# Patient Record
Sex: Female | Born: 1990 | Race: Black or African American | Hispanic: No | Marital: Married | Smoking: Current some day smoker
Health system: Southern US, Community
[De-identification: ages and names within clinical notes are randomized; demographics above are authoritative.]

## PROBLEM LIST (undated history)

## (undated) DIAGNOSIS — D509 Iron deficiency anemia, unspecified: Secondary | ICD-10-CM

## (undated) DIAGNOSIS — Z113 Encounter for screening for infections with a predominantly sexual mode of transmission: Principal | ICD-10-CM

## (undated) DIAGNOSIS — B9689 Other specified bacterial agents as the cause of diseases classified elsewhere: Secondary | ICD-10-CM

## (undated) DIAGNOSIS — N760 Acute vaginitis: Principal | ICD-10-CM

## (undated) HISTORY — PX: BREAST SURGERY: SHX581

---

## 2015-09-06 NOTE — Discharge Summary (Signed)
Inpatient Patient Summary       ;        [facility]  [address]  [place], [place] [address]  [phone]  Patient Discharge Instructions     Name: Annette Hale, Annette Hale  Current Date: 09/06/2015 10:47:04  DOB: [birth year] MRN: [id number] FIN: [id number]%[phone]  Patient Address: [address] [place] [address]  Patient Phone: [phone]  Primary Care Provider:  Name: Ledell Noss  Phone: [phone]   Immunizations Provided:       Discharge Diagnosis: Anterior dislocation of right shoulder  Discharged To: TO, ANTICIPATED%>  Home Treatments: TREATMENTS, ANTICIPATED%>  Devices/Equipment: EQUIPMENT REHAB%>  Post Hospital Services: HOSPITAL SERVICES%>  Professional Skilled Services: SKILLED SERVICES%>  Special Services and Community Resources:                SERV AND COMM RES, ANTICIPATED%>  Mode of Discharge Transportation: TRANSPORTATION%>  Discharge Orders         Discharge Patient 09/06/15 10:39:00 EDT         Comment:      Medications   During the course of your visit, your medication list was updated with the most current information. The details of those changes are reflected below:         New Medications  Printed Prescriptions  acetaminophen-oxyCODONE (Percocet 5/325 oral tablet) 2 Tabs Oral (given by mouth) every 6 hours as needed as needed for pain. Refills: 0., MAX DAILY DOSE OF ACETAMINOPHEN = 3000 MG  Last Dose:____________________  These Medications Were Removed and Should No Longer Be Taken  acetaminophen (Tylenol) 500 Milligram Oral (given by mouth) every 6 hours as needed mild pain (1-3)., MAX DAILY DOSE OF ACETAMINOPHEN = 3000 MG  Stop Taking Reason: Physician Request         [facility] would like to thank you for allowing [place] to assist you with your healthcare needs. The following includes patient education materials and information regarding your injury/illness.     Annette Hale, [place] Annette Hale has been given the following list of follow-up instructions, prescriptions, and patient  education materials:  Follow-up Instructions             With: Address: When:   BRETT YOUNG-MD [address], [address]  [place], [place]  [address]  [phone] Business (1) In 6 days 09/12/2015                       It is important to always keep an active list of medications available so that you can share with other providers and manage your medications appropriately. As an additional courtesy, we are also providing you with your final active medications list that you can keep with you.           acetaminophen-oxyCODONE (Percocet 5/325 oral tablet) 2 Tabs Oral (given by mouth) every 6 hours as needed as needed for pain. Refills: 0., MAX DAILY DOSE OF ACETAMINOPHEN = 3000 MG      Take only the medications listed above. Contact your doctor prior to taking any medications not on this list.        Discharge instructions, if any, will display below     Instructions for Diet: INSTRUCTIONS FOR DIET%>A Healthy Diet   Instructions for Supplements: SUPPLEMENT INSTRUCTIONS%>   Instructions for Activity: INSTRUCTIONS FOR ACTIVITY%>Other: Keep right arm in a sling.   Instructions for Wound Care: INSTRUCTIONS FOR WOUND CARE%>Other: Aqua cell dressing, leave dressing on for one week. May  shower with dressing on.     Medication leaflets, if any, will display below         Patient education materials, if any, will display below           UPPER EXTREMITY (SHOULDER) - Out Patient Post Operative Instructions  General Information:  - You may experience lightheadedness, forgetfulness, dizziness, sleepiness, headache, nausea, sore throat, or muscular pains following surgery  - For any emergencies call 911  -Your reflexes will be dimished after receiving anesthetic drugs         Do not operate a vechicle or heavy machinery for 24 hours         Do not drink any alcoholic beverages or smoke for 24 hours         Avoid making any important decisions for 24 hours         Do not stay alone for the next 24 hours  Diet/Fluids  -Begin  with clear liquids, then progress to your regular diet if no nausea   -Greasy and spicy foods are not advised  Activity  -You are advised to go directly home from the hospital and restrict your activites for the rest of the day  Medications:  -Follow your Discharge Medication sheet, as instructed  -If ordered, begin any newly prescribed medications. Discontinue use if: nausea, vomiting, itching or rash develops and call your doctor   -If you develop a fever (over 101*), chills, active bleeding, excessive swelling, or nausea or vomiting past the 24hr period call your doctor.   Specific Instructions:  -Keep dressing clean, dry and intact for:   -Ice shoulder 48-72 hours, 30 min on, and 30 min off while awake  -You may be more comfortable sleeping propped up or in a recliner  -Check for circulation changes to operative area (pale/blue color)  -Open and close hand to exercise, other exercises as instructed  -Wear sling at all times, even during sleep except for shower and exercises  Follow up Care:  Call the office if you don't already have an appt scheduled.           Discharge Instructions: After Your Surgery   Youve just had surgery. During surgery you were given medicine called anesthesia to keep you relaxed and free of pain. After surgery you may have some pain or nausea. This is common. Here are some tips for feeling better and getting well after surgery.       Stay on schedule with your medication.    Going home   Your doctor or nurse will show you how to take care of yourself when you go home. He or she will also answer your questions. Have an adult family member or friend drive you home. For the first 24 hours after your surgery:    Do not drive or use heavy equipment.    Do not make important decisions or sign legal papers.    Do not drink alcohol.    Have someone stay with you, if needed. He or she can watch for problems and help keep you safe.   Be sure to go to all follow-up visits with your doctor. And  rest after your surgery for as long as your doctor tells you to.   Coping with pain   If you have pain after surgery, pain medicine will help you feel better. Take it as told, before pain becomes severe. Also, ask your doctor or pharmacist about other ways to control pain. This might be  with heat, ice, or relaxation. And follow any other instructions your surgeon or nurse gives you.   Tips for taking pain medicine   To get the best relief possible, remember these points:    Pain medicines can upset your stomach. Taking them with a little food may help.    Most pain relievers taken by mouth need at least 20 to 30 minutes to start to work.    Taking medicine on a schedule can help you remember to take it. Try to time your medicine so that you can take it before starting an activity. This might be before you get dressed, go for a walk, or sit down for dinner.    Constipation is a common side effect of pain medicines. Call your doctor before taking any medicines such as laxatives or stool softeners to help ease constipation. Also ask if you should skip any foods. Drinking lots of fluids and eating foods such as fruits and vegetables that are high in fiber can also help. Remember, do not take laxatives unless your surgeon has prescribed them.    Drinking alcohol and taking pain medicine can cause dizziness and slow your breathing. It can even be deadly. Do not drink alcohol while taking pain medicine.    Pain medicine can make you react more slowly to things. Do not drive or run machinery while taking pain medicine.   Your health care provider may tell you to take acetaminophen to help ease your pain. Ask him or her how much you are supposed to take each day. Acetaminophen or other pain relievers may interact with your prescription medicines or other over-the-counter (OTC) drugs. Some prescription medicines have acetaminophen and other ingredients. Using both prescription and OTC acetaminophen for pain can cause  you to overdose. Read the labels on your OTC medicines with care. This will help you to clearly know the list of ingredients, how much to take, and any warnings. It may also help you not take too much acetaminophen. If you have questions or do not understand the information, ask your pharmacist or health care provider to explain it to you before you take the OTC medicine.   Managing nausea   Some people have an upset stomach after surgery. This is often because of anesthesia, pain, or pain medicine, or the stress of surgery. These tips will help you handle nausea and eat healthy foods as you get better. If you were on a special food plan before surgery, ask your doctor if you should follow it while you get better. These tips may help:    Do not push yourself to eat. Your body will tell you when to eat and how much.    Start off with clear liquids and soup. They are easier to digest.    Next try semi-solid foods, such as mashed potatoes, applesauce, and gelatin, as you feel ready.    Slowly move to solid foods. Dont eat fatty, rich, or spicy foods at first.    Do not force yourself to have 3 large meals a day. Instead eat smaller amounts more often.    Take pain medicines with a small amount of solid food, such as crackers or toast, to avoid nausea.       Call your surgeon if.    You still have pain an hour after taking medicine. The medicine may not be strong enough.    You feel too sleepy, dizzy, or groggy. The medicine may be too strong.    You  have side effects like nausea, vomiting, or skin changes, such as rash, itching, or hives.        If you have obstructive sleep apnea   You were given anesthesia medicine during surgery to keep you comfortable and free of pain. After surgery, you may have more apnea spells because of this medicine and other medicines you were given. The spells may last longer than usual.    At home:    Keep using the continuous positive airway pressure (CPAP) device when you  sleep. Unless your health care provider tells you not to, use it when you sleep, day or night. CPAP is a common device used to treat obstructive sleep apnea.    Talk with your provider before taking any pain medicine, muscle relaxants, or sedatives. Your provider will tell you about the possible dangers of taking these medicines.      [address] [employer] [address] [place], [place] [address]. All rights reserved. This information is not intended as a substitute for professional medical care. Always follow your healthcare professional's instructions.               IS IT A STROKE?  Act FAST and Check for these signs:     FACE                  Does the face look uneven?     ARM                    Does one arm drift down?     SPEECH             Does their speech sound strange?     TIME                   Call 9-1-1 at any sign of stroke  Heart Attack Signs  Chest discomfort: Most heart attacks involve discomfort in the center of the chest and lasts more than a few minutes, or goes away and comes back. It can feel like uncomfortable pressure, squeezing, fullness or pain.  Discomfort in upper body: Symptoms can include pain or discomfort in one or both arms, back, neck, jaw or stomach.  Shortness of breath: With or without discomfort.  Other signs: Breaking out in a cold sweat, nausea, or lightheaded.  Remember, MINUTES DO MATTER. If you experience any of these heart attack warning signs, call 9-1-1 to get immediate medical attention!             Yes - Patient/Family/Caregiver demonstrates understanding of instructions given  ______________________________ ___________ ___________________ ___________  Patient/Family/ Caregiver Signature Date/Time          Provider Signature Date/Time

## 2015-09-06 NOTE — Nursing Note (Signed)
Nursing Discharge Summary - Text       Physician Discharge Summary Entered On:  09/06/2015 10:45 EDT    Performed On:  09/06/2015 10:44 EDT by Alexandria LodgeMERRILL-MD,  Tre Sanker               DC Information   Provider Instructions for Diet :   A Healthy Diet   Provider Instructions for Activity :   Other: Keep right arm in a sling.   Provider Instructions for Wound Care :   Other: Aqua cell dressing, leave dressing on for one week. May shower with dressing on.   Autymn Omlor-MD,  Shakena Callari - 09/06/2015 10:44 EDT

## 2015-09-06 NOTE — Procedures (Signed)
 IntraOp Record - [facility]             IntraOp Record - [facility] Summary                                                                   Primary Physician:        ANNIE ECK    Case Number:              [phone]    Finalized Date/Time:      09/06/15 19:48:27    Pt. Name:                 Annette Hale, Annette Hale    D.O.B./Sex:               [birth year]    Female    Med Rec #:                [id number]    Physician:                ANNIE ECK    Financial #:              [id number]    Pt. Type:                 S    Room/Bed:                 /    Admit/Disch:              09/06/15 06:30:00 -                              09/06/15 13:54:00    Institution:       [facility] - Case Times                                                                                                         Entry 1                                                                                                          Patient      In Room Time             09/06/15 07:48:00               Out Room Time  09/06/15 10:31:00    Anesthesia     Procedure      Start Time               09/06/15 08:18:00               Stop Time                       09/06/15 10:22:00    Last Modified By:         JANE RN, BONNIE                              09/06/15 10:36:16      [facility] - Case Times Audit                                                                          09/06/15 10:36:16         Owner: KENNIE                               Modifier: DUNNBO                                                        <+> 1         Out Room Time     09/06/15 10:29:55         Owner: KENNIE                               Modifier: DUNNBO                                                        <+> 1         Stop Time        [facility] - Safety Checklist - Sign In                                                                                         Entry 1  History/Physical on       Yes                             Procedure Consent               Yes    Chart                                                     on Chart     Site Marked (if           Yes    applicable)     Last Modified By:         JANE, RN, BONNIE                              09/06/15 08:34:59      [facility] - Case Attendance                                                                                                    Entry 1                         Entry 2                         Entry 3                                          Case Attendee             ATKINSON-MD,  BENEDETTA PEPER,  FRANCIS JANE, RN, BONNIE    Role Performed            Anesthesiologist                Surgeon Primary                 Circulator    Time In                   09/06/15 07:48:00               09/06/15 07:48:00               09/06/15 07:48:00    Time Out                  09/06/15 10:31:00               09/06/15 10:31:00               09/06/15 10:31:00  Procedure                 Shoulder Arthroscopy            Shoulder Arthroscopy            Shoulder Arthroscopy                              with Open Repair(Right)         with Open Repair(Right)         with Open Repair(Right)    Last Modified By:         JANE RN, CONSUELO JANE, RN, CONSUELO JANE, RN, BONNIE                              09/06/15 10:38:03               09/06/15 10:38:03               09/06/15 10:38:03                                Entry 4                         Entry 5                                                                          Case Attendee             Suzy Francis VEAR JOELYN,  BRETT H    Role Performed            Surgical Scrub                  Surgeon Secondary    Time In                   09/06/15 07:48:00    Time Out                  09/06/15 10:31:00    Procedure                 Shoulder Arthroscopy            Shoulder Arthroscopy                               with Open Repair(Right)         with Open Repair(Right)    Last Modified By:         JANE RN, CONSUELO JANE, RN, BONNIE                              09/06/15 10:38:03  09/06/15 10:38:03    General Comments:            Quintin Kuba, [employer] rep Norleen Furrow, resident      [facility] - Case Attendance Audit                                                                     09/06/15 10:38:03         Owner: KENNIE                               Modifier: DUNNBO                                                            1     <*> Time Out            1     <*> Procedure            1     <*> Procedure                              Shoulder Arthroscopy with Open Repair(Right)            1     <*> Procedure                              Shoulder Arthroscopy with Open Repair(Right)            2     <*> Time Out            2     <*> Procedure            2     <*> Procedure                              Shoulder Arthroscopy with Open Repair(Right)            2     <*> Procedure                              Shoulder Arthroscopy with Open Repair(Right)            3     <*> Time Out            3     <*> Procedure            3     <*> Procedure                              Shoulder Arthroscopy with Open Repair(Right)            3     <*> Procedure  Shoulder Arthroscopy with Open Repair(Right)            4     <*> Time Out            4     <*> Procedure            4     <*> Procedure                              Shoulder Arthroscopy with Open Repair(Right)            4     <*> Procedure                              Shoulder Arthroscopy with Open Repair(Right)            5     <*> Case Attendee                          YOUNG-MD,  BRETT H            5     <*> Role Performed            5     <*> Procedure     09/06/15 09:08:57         Owner: DUNNBO                               Modifier: DUNNBO                                                            2     <*> Procedure            2      <*> Procedure            2     <*> Procedure                              Shoulder Arthroscopy with Open Repair(Right)            2     <*> Procedure                              Shoulder Arthroscopy with Open Repair(Right)            2     <*> Procedure                              Shoulder Arthroscopy with Open Repair(Right)            2     <*> Procedure                              Shoulder Arthroscopy with Open Repair(Right)            3     <*> Procedure  3     <*> Procedure            3     <*> Procedure                              Shoulder Arthroscopy with Open Repair(Right)            3     <*> Procedure                              Shoulder Arthroscopy with Open Repair(Right)            3     <*> Procedure                              Shoulder Arthroscopy with Open Repair(Right)            3     <*> Procedure                              Shoulder Arthroscopy with Open Repair(Right)            4     <*> Time In            4     <*> Time In            4     <*> Time In            4     <*> Procedure            4     <*> Procedure            4     <*> Procedure                              Shoulder Arthroscopy with Open Repair(Right)            4     <*> Procedure                              Shoulder Arthroscopy with Open Repair(Right)            4     <*> Procedure                              Shoulder Arthroscopy with Open Repair(Right)            4     <*> Procedure                              Shoulder Arthroscopy with Open Repair(Right)     09/06/15 09:08:56         Owner: ILWWAN                               Modifier: DUNNBO  1     <*> Procedure            1     <*> Procedure            1     <*> Procedure                              Shoulder Arthroscopy with Open Repair(Right)            1     <*> Procedure                              Shoulder Arthroscopy with Open Repair(Right)            1     <*> Procedure                               Shoulder Arthroscopy with Open Repair(Right)            1     <*> Procedure                              Shoulder Arthroscopy with Open Repair(Right)     09/06/15 08:36:38         Owner: ILWWAN                               Modifier: DUNNBO                                                            1     <*> Procedure                              Shoulder Arthroscopy with Open Repair(Right)            1     <*> Procedure                              Shoulder Arthroscopy with Open Repair(Right)            1     <*> Procedure                              Shoulder Arthroscopy with Open Repair(Right)            1     <*> Procedure                              Shoulder Arthroscopy with Open Repair(Right)            2     <*> Procedure                              Shoulder Arthroscopy with Open Repair(Right)  2     <*> Procedure                              Shoulder Arthroscopy with Open Repair(Right)            2     <*> Procedure                              Shoulder Arthroscopy with Open Repair(Right)            2     <*> Procedure                              Shoulder Arthroscopy with Open Repair(Right)            3     <*> Time In            3     <*> Time In            3     <*> Time In            3     <*> Procedure                              Shoulder Arthroscopy with Open Repair(Right)            3     <*> Procedure                              Shoulder Arthroscopy with Open Repair(Right)            3     <*> Procedure                              Shoulder Arthroscopy with Open Repair(Right)            3     <*> Procedure                              Shoulder Arthroscopy with Open Repair(Right)            4     <*> Case Attendee                          Barnhill,  Tabatha H            4     <*> Case Attendee                          Barnhill,  Tabatha H            4     <*> Case Attendee                          Suzy Rhyme H            4     <*> Role Performed            4     <*>  Role Performed  4     <*> Role Performed            4     <+> Procedure     09/06/15 08:34:53         Owner: DUNNBO                               Modifier: DUNNBO                                                            1     <*> Time In            1     <*> Time In            1     <*> Time In            1     <*> Procedure                              Shoulder Arthroscopy with Open Repair(Right)            1     <*> Procedure                              Shoulder Arthroscopy with Open Repair(Right)            1     <*> Procedure                              Shoulder Arthroscopy with Open Repair(Right)            1     <*> Procedure                              Shoulder Arthroscopy with Open Repair(Right)            2     <*> Time In            2     <*> Time In            2     <*> Time In            2     <*> Procedure                              Shoulder Arthroscopy with Open Repair(Right)            2     <*> Procedure                              Shoulder Arthroscopy with Open Repair(Right)            2     <*> Procedure                              Shoulder Arthroscopy with Open Repair(Right)  2     <*> Procedure                              Shoulder Arthroscopy with Open Repair(Right)            3     <*> Case Attendee                          ROSS-DUNN, RN, BONNIE            3     <*> Case Attendee                          ROSS-DUNN, RN, BONNIE            3     <*> Case Attendee                          ROSS-DUNN, RN, BONNIE            3     <*> Role Performed            3     <*> Role Performed            3     <*> Role Performed            3     <+> Procedure        [facility] - Skin Assessment                                                                          Pre-Care Text:            A.240 Assesses baseline skin condition Im.120 Implements protective measures to prevent skin or tissue injury           due to mechanical sources  Im.280.1 Implements progective measures to prevent  skin or tissue injury due to           thermal sources Im.360 Monitors for signs and symptons of infection                              Entry 1                                                                                                          Skin Integrity            Intact    Last Modified By:         JANE, RN, BONNIE  09/06/15 08:41:34    Post-Care Text:            E.10 Evaluates for signs and symptoms of physical injury to skin and tissue E.270 Evaluate tissue perfusion           O.60 Patient is free from signs and symptoms of injury caused by extraneous objects   O.210 Patinet's tissue           perfusion is consistent with or improved from baseline levels      [facility] - Patient Positioning                                                                      Pre-Care Text:            A.240 Assesses baseline skin condition A.280 Identifies baseline musculoskeletal status A.280.1 Identifies           physical alterations that require additional precautions for procedure-specific positioning A.510.8 Maintains           patient's dignity and privacy Im.120 Implements protective measures to prevent skin/tissue injury due to           mechanical sources Im.40 Positions the patient Im.80 Applies safety devices                              Entry 1                                                                                                          Procedure                 Shoulder Arthroscopy            Body Position                   Beach Chair                              with Open Repair(Right)    Left Arm Position         Flexed on Padded Arm            Right Arm Position              Held on [employer] w/Security Strap    Left Leg Position         Pillow Under Knee               Right Leg Position              Pillow Under  Knee    Feet Uncrossed            Yes                             Pressure Points                 Yes                                                               Checked     Additional                Patient in Sitting              Positioning Device              Beach Chair/Shoulder    Information               Position with Wedge                                             Table, Pillow, Safety                              Support under legs                                              Strap, Foam Wedge                              Heels off mattress    Positioned By             ATKINSON-MD,  RANDAL,           Outcome Met (O.80)              Yes                              MERRILL-MD,  KEITH,                              ROSS-DUNN, RN, BONNIE    Last Modified By:         JANE, RN, BONNIE                              09/06/15 08:41:27    Post-Care Text:            A.240 Assesses baseline skin condition A.280 Identifies baseline musculoskeletal status A.280.1 Identifies           physical alterations that require additional precautions for procedure-specific positioning A.510.8 Maintains           patient's dignity and privacy Im.120 Implements protective measures to prevent skin/tissue injury due to  mechanical sources Im.40 Positions the patient Im.80 Applies safety devices      [facility] - Skin Prep                                                                                Pre-Care Text:            A.30 Verifies allergies A.20 Verifies procedure, surgical site, and laterality A.510.8 Maintains paritnet's           dignity and privacy Im.270 Performs Skin Preparation Im.270.1 Implements protective measures to prevent skin           and tissue injury due to chemical sources  A.300.1 Protects from cross-contamination                              Entry 1                                                                                                          Hair Removal     Skin Prep      Prep Agents (Im.270)     Chlorhexidine Gluconate         Prep Area (Im.270)              Shoulder, Arm                              2%  w/Alcohol     Prep Area Details        Right                           Prep By                         ANNIE ECK    Outcome Met (O.100)       Yes    Last Modified ByBETHA MEDAL, RN, BONNIE                              09/06/15 08:41:51    Post-Care Text:            E.10 Evaluates for signs and symptoms of physical injury to skin and tissue O.100 Patient is free from signs           and symptoms of chemical injury  O.740 The patient's right to privacy is maintained      [facility] - Counts Initial and Final  Pre-Care Text:            A.20.2 - Assesses the risk for unintended retained surgical items Im.20 - Performs required counts                              Entry 1                                                                                                          Initial Counts      Initial Counts           ROSS-DUNN, RN, BONNIE,          Items included in               Sponges, Sharps     Performed By             Suzy Francis DEL            the Initial Count     Final Counts      Final Counts             ROSS-DUNN, RN, BONNIE,          Final Count Status              Correct     Performed By             Suzy Francis H     Items Included in        Sponges, Sharps     Final Count     Outcome Met (O.20)        Yes    Last Modified By:         JANE, RN, BONNIE                              09/06/15 10:42:45    Post-Care Text:            E.50 - Evaluates results of the surgical count O.20 - Patient is free from unintended retained surgical items      [facility] - Counts Initial and Final Audit                                                            09/06/15 10:42:45         Owner: DUNNBO                               Modifier: DUNNBO                                                        <+>  1         Final Count Status        <+> 1         Outcome Met (O.20)        [facility] - General Case Data                                                                         Pre-Care Text:            A.350.1 Classifies surgical wound                              Entry 1                                                                                                          Case Information      ASA Class                2                               Case Level                      Level 3     OR                       RH1 04                          Specialty                       Orthopedic (SN)     Wound Class              1-Clean    Preop Diagnosis           RIGHT SHOULDER                              INSTABILITY    Last Modified ByBETHA MEDAL, RN, BONNIE                              09/06/15 09:12:28    Post-Care Text:            O.760 Patient receives consistent and comparable care regardless of the setting      [facility] - Fire Risk Assessment  Entry 1                                                                                                          Fire Risk                 Surgical Site Above             Fire Risk Score                 2    Assessment: If            Xiphoid, Ignition    checked, checkmark        Source In Use    = 1 point     Last Modified By:         JANE, RN, BONNIE                              09/06/15 08:38:12      [facility] - Safety Checklist - Sign Out                                                              Pre-Care Text:            Im.330 Manages specimen handling and disposition                              Entry 1                                                                                                          Patient Safety            Yes    Communication Guide     Used Throughout Case     Last Modified By:         JANE, RN, BONNIE                              09/06/15 08:35:19    Post-Care Text:            E.800 Ensures continuity of care E.50 Evaluates results of the surgical count O.30 Patient's  procedure is           performed on the correct site, side,  and level O.50 patient's current status is communicated throughout the           continuum of care O.40 Patient's specimen(s) is managed in the appropriate manner      [facility] - Cautery                                                                                  Pre-Care Text:            A.240 Assesses baseline skin condition A280.1 Identifies baseline musculoskeletal status Im.50 Implements           protective measures to prevent injury due to electrical sources  Im.60 Uses supplies and equipment within safe           parameters Im.80 Applies safety devices                              Entry 1                                                                                                          ESU Type                  GENERATOR                       Identification                  [id number]                              [employer]              Number     Coag Setting (watts)      4040                            Cut Setting (watts)             40    Grounding Pad             Yes                             Grounding Pad Site              Thigh, left    Needed?     Grounding Pad             ROSS-DUNN, RN, BONNIE           Outcome Met (O.10)  Yes    Applied By     Last Modified By:         JANE, RN, BONNIE                              09/06/15 09:07:30    Post-Care Text:            E.10 Evaluates for signs and symptoms of physical injury to skin and tissue O.10 Patient is free from signs and           symptoms of injury related to thermal sources  O.70 Patient is free from signs and symptoms of electrical injury      [facility] - Patient Care Devices                                                                     Pre-Care Text:            A.200 Assesses risk for normothermia regulation A.40 Verifies presence of prosthetics or corrective devices           Im.280 Implements thermoregulation measures Im.60 Uses supplies and equipment  within safe parameters                              Entry 1                         Entry 2                                                                          Equipment Type            MACHINE SEQUENTIAL              BAIR HUGGER                              COMPRESSION    SCD Sleeve Site           Legs Bilateral    Equipment/Tag Number      [id number]                          [id number]    Initiated Pre             Yes    Induction     Last Modified By:         JANE RN, CONSUELO JANE, RN, BONNIE                              09/06/15 08:40:21               09/06/15 08:40:21    Post-Care Text:  E.10 Evaluates signs and symptoms of physical injury to skin and tissue O.60 Patient is free from signs and           symptoms of injury caused by extraneous objects      [facility] - Medications                                                                              Pre-Care Text:            A.10 Confirms patient identity A.30 Verifies allergies Im.220 Administers prescribed medications Im.220.2           Administers prescribed antibiotic therapy as ordered                              Entry 1                                                                                                          Time Administered         09/06/15 08:12:00               Medication                      EPINEPRINE 1:1000 - 30CC    Route of Admin            Irrigation                      Dose/Volume                     3ml/3000ml lr                                                              (include amount and                                                               unit of measure)     Site                      Shoulder                        Site Detail  Right    Administered By           ANNIE ECK              Outcome Met (O.130)             Yes    Last Modified By:         JANE, RN, BONNIE                              09/06/15 08:39:36    Post-Care Text:            E.20  Evaluates response to medications O.130 Patient receives appropriately administerd medication(s)      [facility] - Medications Audit                                                                         09/06/15 08:39:36         Owner: DUNNBO                               Modifier: DUNNBO                                                            1     <*> Medication                             EPINEPRINE 1:1000 - 30CC            1     <*> Time Administered                      09/06/15 08:38:00        [facility] - Implants/Endoscopy Stents                                                                Pre-Care Text:            A.20 Verifies operative procedure, surgical site, and laterality A.20.1 Verifies consent for planned procedure           Im.350 Records implants inserted during the operative or invasive procedure                              Entry 1  Implant/Explant           Implant                         Catalog #                      [id number]    Implant     Identification      Description              SCREW LATARJET CANN             Manufacturer                    [employer]                              PARTIALLY THREADED                              3.75MM X ARTHREX                              [id number]    Usage Data      Implant Site             right shoulder                  Quantity                        11    Last Modified By:         JANE, RN, BONNIE                              09/06/15 09:54:16    Post-Care Text:            E.30 Evaluates verification process for correct patient, site, side and level surgery O.30 Patient's procedure           is performed on the correct site, side, and level      RHOR - Communication                                                                            Pre-Care Text:            A.520 Identifies barriers to communication (Patient and Family  Communications) A.20 Verifies operative           procedure, surgical site, and laterality Sports coach) Im.500 Provides status reports to family           members Im.150 Develops individualized plan of care                              Entry 1  Communication             Phone Call                      Communication By                JANE OBIE MILLER    Date and Time             09/06/15 09:28:00               Communication To                husband-Ricky    Last Modified By:         JANE RN, BONNIE                              09/06/15 09:53:04    Post-Care Text:            E.520 Evaluates psychosocial response to plan of care O.500 Patient or designated support person demonstrates           knowledge of the expected psychosocial responses to the procedure E.800 Ensures continuity of care O.50           Patient's current status is communicated throughout the continuum of care      RHOR - Dressing/Packing                                                                         Pre-Care Text:            A.350 Assesses susceptibility for infection Im.250 Administers care to invasive devices Im.290 Administer care           to wound sites  Im.300 Implements aseptic technique                              Entry 1                                                                                                          Site                      Shoulder                        Site Details                    Right    Dressing Item     Details      Dressing Item            Silver Impregnated  Miscellaneous                   Shoulder     (Im.290)                 Dressing, Self Adherent         (Im.290)                        Immobilizer/Sling                              Wrap, 4x4's    Last Modified By:         JANE, RN, BONNIE                              09/06/15 10:41:31    Post-Care  Text:            E.320 Evaluate factors associted with increased risk for postoperative infection at the completion of the           procedure O.200 Patient's wound perfusion is consistent with or improved from baseline levels  O.Patient is           free from signs and symptoms of infection      RHOR - Procedures                                                                               Pre-Care Text:            A.20 Verifies operative procedure, surgical site, and laterality Im.150 Develops individualized plan of care                              Entry 1                                                                                                          Procedure     Description      Procedure                Shoulder Arthroscopy            Modifiers                       Right                              with Open Repair     Surgical Procedure       SHOULDER ARTHROSCOPY,     Text  OPEN LATERJET PROCEDURE                              RIGHT    Primary Procedure         Yes                             Primary Surgeon                 ANNIE ECK    Start                     09/06/15 08:18:00               Stop                            09/06/15 10:22:00    Anesthesia Type           General                         Surgical Service                Orthopedic (SN)    Wound Class               1-Clean    Last Modified By:         JANE RN, BONNIE                              09/06/15 10:38:07    Post-Care Text:            O.730 The patinet's care is consistent with the individualized perioperative plan of care      RHOR - Procedures Audit                                                                          09/06/15 10:38:07         Owner: DUNNBO                               Modifier: DUNNBO                                                        <+> 1         Stop        RHOR - Safety Checklist - Time Out                                                              Pre-Care Text:  A.10 Confirms patient identity A.20 Verifies operative procedure, surgical site, and laterality A.20.1 Verifies           consent for planned procedure A.30 Verifies allergies                              Entry 1                                                                                                          Surgical/Procedure        Yes                             Time Out Complete               09/06/15 08:17:00    Team confirms     correct patient,     correct site and     correct procedure     Last Modified By:         JANE RN, BONNIE                              09/06/15 08:35:07    Post-Care Text:            E.30 Evaluates verification process for correct patient, site, side, and level surgery      RHOR - Transfer                                                                                                           Entry 1                                                                                                          Transferred By            JANE, RN, BONNIE,          Via                             [employer]  ATKINSON-MD,  RANDAL    Post-op Destination       PACU    Skin Assessment      Condition                Intact    Last Modified By:         JANE, RN, BONNIE                              09/06/15 08:42:25      Case Comments                                                                                         <None>              Finalized By: EVERARDO NEST      Document Signatures                                                                             Signed By:           JANE RN, BONNIE 09/06/15 13:24          ROSS-DUNN, RN, BONNIE 09/06/15 10:42          TZMUS,  JENNIFER 09/06/15 19:48      Unfinalized History                                                                                     Date/Time            Username    Reason for Unfinalizing         Freetext Reason for Unfinalizing                                           09/06/15 13:19       DUNNBO      Finish Documentation          09/06/15 19:47       [employer]      Charging

## 2015-09-06 NOTE — Discharge Summary (Signed)
 Inpatient Clinical Summary             [facility]  Post-Acute Care Transfer Instructions  PERSON INFORMATION   Name: Annette Hale, Annette Hale   MRN: [id number]    FIN#: [id number]   PHYSICIANS  Admitting Physician: ANNIE ECK  Attending Physician: ANNIE ECK   PCP: PAULENE LAMAR PARAS  Discharge Diagnosis: Anterior dislocation of right shoulder  Comment:       PATIENT EDUCATION INFORMATION  Instructions:             OUT PATIENT POST OP JI SHOULDER (CUSTOM); Anesthesia: After Your Surgery  Medication Leaflets:               Follow-up:                          With: Address: When:   BRETT YOUNG-MD [address], [address]  [place]  [address]  [phone] Business (1) In 6 days 09/12/2015                           MEDICATION LIST  Medication Reconciliation at Discharge:         New Medications  Printed Prescriptions  acetaminophen-oxyCODONE (Percocet 5/325 oral tablet) 2 Tabs Oral (given by mouth) every 6 hours as needed as needed for pain. Refills: 0., MAX DAILY DOSE OF ACETAMINOPHEN = 3000 MG  Last Dose:____________________  These Medications Were Removed and Should No Longer Be Taken  acetaminophen (Tylenol) 500 Milligram Oral (given by mouth) every 6 hours as needed mild pain (1-3)., MAX DAILY DOSE OF ACETAMINOPHEN = 3000 MG  Stop Taking Reason: Physician Request         Patient's Final Home Medication List Upon Discharge:          acetaminophen-oxyCODONE (Percocet 5/325 oral tablet) 2 Tabs Oral (given by mouth) every 6 hours as needed as needed for pain. Refills: 0., MAX DAILY DOSE OF ACETAMINOPHEN = 3000 MG         Comment:       ORDERS         Order Name Order Details   Discharge Patient 09/06/15 10:39:00 EDT

## 2015-09-07 NOTE — Nursing Note (Signed)
Nursing Discharge Summary - Text       Nursing Discharge Summary Entered On:  09/07/2015 2:45 EDT    Performed On:  09/07/2015 2:44 EDT by Harlin Heysollins, RN, Kishma A               DC Information   Discharge To, Anticipated :   Home with family support   Mode of Discharge :   Wheelchair   Transportation :   Private vehicle   Accompanied By :   Burnard LeighParent   Rollins, RN, Kishma A - 09/07/2015 2:44 EDT
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
## 2018-04-05 NOTE — ED Notes (Signed)
ED Patient Education Note     Patient Education Materials Follows:  Musculoskeletal     Muscle Cramps and Spasms    Muscle cramps and spasms occur when a muscle or muscles tighten and you have no control over this tightening (involuntary muscle contraction). They are a common problem and can develop in any muscle. The most common place is in the calf muscles of the leg. Both muscle cramps and muscle spasms are involuntary muscle contractions, but they also have differences:        Muscle cramps are sporadic and painful. They may last a few seconds to a quarter of an hour. Muscle cramps are often more forceful and last longer than muscle spasms.     Muscle spasms may or may not be painful. They may also last just a few seconds or much longer.     CAUSES    It is uncommon for cramps or spasms to be due to a serious underlying problem. In many cases, the cause of cramps or spasms is unknown. Some common causes are:      Overexertion. ?     Overuse from repetitive motions (doing the same thing over and over). ?     Remaining in a certain position for a long period of time. ?     Improper preparation, form, or technique while performing a sport or activity. ?     Dehydration. ?     Injury. ?     Side effects of some medicines. ?     Abnormally low levels of the salts and ions in your blood (electrolytes), especially potassium and calcium. This could happen if you are taking water pills (diuretics) or you are pregnant. ?     Some underlying medical problems can make it more likely to develop cramps or spasms. These include, but are not limited to:      Diabetes. ?     Parkinson disease. ?     Hormone disorders, such as thyroid problems. ?     Alcohol abuse. ?     Diseases specific to muscles, joints, and bones. ?     Blood vessel disease where not enough blood is getting to the muscles. ?     HOME CARE INSTRUCTIONS     Stay well hydrated. Drink enough water and fluids to keep your urine clear or pale yellow.     It may be  helpful to massage, stretch, and relax the affected muscle.     For tight or tense muscles, use a warm towel, heating pad, or hot shower water directed to the affected area.     If you are sore or have pain after a cramp or spasm, applying ice to the affected area may relieve discomfort.    ? Put ice in a plastic bag.    ? Place a towel between your skin and the bag.    ? Leave the ice on for 15-20 minutes, 03-04 times a day.     Medicines used to treat a known cause of cramps or spasms may help reduce their frequency or severity. Only take over-the-counter or prescription medicines as directed by your caregiver.     SEEK MEDICAL CARE IF:    Your cramps or spasms get more severe, more frequent, or do not improve over time.     MAKE SURE YOU:     Understand these instructions.      Will watch your condition.     Will get   help right away if you are not doing well or get worse.    This information is not intended to replace advice given to you by your health care provider. Make sure you discuss any questions you have with your health care provider.    Document Released: 08/09/2001 Document Revised: 06/14/2012 Document Reviewed: 11/21/2014  Elsevier Interactive Patient Education ?2016 [employer]      Procedures     Cryotherapy    Cryotherapy means treatment with cold. Ice or gel packs can be used to reduce both pain and swelling. Ice is the most helpful within the first 24 to 48 hours after an injury or flare-up from overusing a muscle or joint. Sprains, strains, spasms, burning pain, shooting pain, and aches can all be eased with ice. Ice can also be used when recovering from surgery. Ice is effective, has very few side effects, and is safe for most people to use.      PRECAUTIONS    Ice is not a safe treatment option for people with:     Raynaud phenomenon. This is a condition affecting small blood vessels in the extremities. Exposure to cold may cause your problems to return.     Cold hypersensitivity. There are  many forms of cold hypersensitivity, including:     ? Cold urticaria. Red, itchy hives appear on the skin when the tissues begin to warm after being iced.     ? Cold erythema. This is a red, itchy rash caused by exposure to cold.     ? Cold hemoglobinuria. Red blood cells break down when the tissues begin to warm after being iced. The hemoglobin that carry oxygen are passed into the urine because they cannot combine with blood proteins fast enough.      Numbness or altered sensitivity in the area being iced.     If you have any of the following conditions, do not use ice until you have discussed cryotherapy with your caregiver:     Heart conditions, such as arrhythmia, angina, or chronic heart disease.     High blood pressure.     Healing wounds or open skin in the area being iced.     Current infections.     Rheumatoid arthritis.     Poor circulation.     Diabetes.    Ice slows the blood flow in the region it is applied. This is beneficial when trying to stop inflamed tissues from spreading irritating chemicals to surrounding tissues. However, if you expose your skin to cold temperatures for too long or without the proper protection, you can damage your skin or nerves. Watch for signs of skin damage due to cold.    HOME CARE INSTRUCTIONS    Follow these tips to use ice and cold packs safely.     Place a dry or damp towel between the ice and skin. A damp towel will cool the skin more quickly, so you may need to shorten the time that the ice is used.     For a more rapid response, add gentle compression to the ice.      Ice for no more than 10 to 20 minutes at a time. The bonier the area you are icing, the less time it will take to get the benefits of ice.     Check your skin after 5 minutes to make sure there are no signs of a poor response to cold or skin damage.      Rest 20 minutes  or more between uses.     Once your skin is numb, you can end your treatment. You can test numbness by very lightly touching your  skin. The touch should be so light that you do not see the skin dimple from the pressure of your fingertip. When using ice, most people will feel these normal sensations in this order: cold, burning, aching, and numbness.     Do not use ice on someone who cannot communicate their responses to pain, such as small children or people with dementia.     HOW TO MAKE AN ICE PACK    Ice packs are the most common way to use ice therapy. Other methods include ice massage, ice baths, and cryosprays. Muscle creams that cause a cold, tingly feeling do not offer the same benefits that ice offers and should not be used as a substitute unless recommended by your caregiver.    To make an ice pack, do one of the following:     Place crushed ice or a bag of frozen vegetables in a sealable plastic bag. Squeeze out the excess air. Place this bag inside another plastic bag. Slide the bag into a pillowcase or place a damp towel between your skin and the bag.     Mix 3 parts water with 1 part rubbing alcohol. Freeze the mixture in a sealable plastic bag. When you remove the mixture from the freezer, it will be slushy. Squeeze out the excess air. Place this bag inside another plastic bag. Slide the bag into a pillowcase or place a damp towel between your skin and the bag.    SEEK MEDICAL CARE IF:     You develop white spots on your skin. This may give the skin a blotchy (mottled) appearance.     Your skin turns blue or pale.     Your skin becomes waxy or hard.     Your swelling gets worse.    MAKE SURE YOU:     Understand these instructions.      Will watch your condition.     Will get help right away if you are not doing well or get worse.    This information is not intended to replace advice given to you by your health care provider. Make sure you discuss any questions you have with your health care provider.    Document Released: 10/14/2010 Document Revised: 03/10/2014 Document Reviewed: 11/01/2014  Elsevier Interactive Patient  Education ?2016 [employer].

## 2018-12-24 ENCOUNTER — Emergency Department (HOSPITAL_COMMUNITY): Payer: Medicaid Other

## 2018-12-24 ENCOUNTER — Emergency Department (HOSPITAL_COMMUNITY)
Admission: EM | Admit: 2018-12-24 | Discharge: 2018-12-24 | Disposition: A | Discharge status: Home / Self Care | Payer: Medicaid Other | Attending: Emergency Medicine | Admitting: Emergency Medicine

## 2018-12-24 DIAGNOSIS — S93401A Sprain of unspecified ligament of right ankle, initial encounter: Secondary | ICD-10-CM | POA: Diagnosis not present

## 2018-12-24 DIAGNOSIS — Y999 Unspecified external cause status: Secondary | ICD-10-CM | POA: Insufficient documentation

## 2018-12-24 DIAGNOSIS — Y92480 Sidewalk as the place of occurrence of the external cause: Secondary | ICD-10-CM | POA: Insufficient documentation

## 2018-12-24 DIAGNOSIS — W101XXA Fall (on)(from) sidewalk curb, initial encounter: Secondary | ICD-10-CM | POA: Diagnosis not present

## 2018-12-24 DIAGNOSIS — S99911A Unspecified injury of right ankle, initial encounter: Secondary | ICD-10-CM | POA: Diagnosis present

## 2018-12-24 DIAGNOSIS — Y939 Activity, unspecified: Secondary | ICD-10-CM | POA: Insufficient documentation

## 2018-12-24 MED ORDER — NAPROXEN 500 MG PO TABS: 500.0000 mg | ORAL_TABLET | Freq: Two times a day (BID) | ORAL | 0 refills | Status: AC | PRN

## 2018-12-24 NOTE — ED Notes (Signed)
Ortho paged for crutches and ASO

## 2018-12-24 NOTE — ED Notes (Signed)
Patient verbalized understanding of dc instructions, vss, ambulatory with nad.   

## 2018-12-24 NOTE — ED Triage Notes (Signed)
Pt reports twisting her right ankle last night- has swelling and pain to lateral ankle.

## 2018-12-24 NOTE — ED Provider Notes (Signed)
[facility] EMERGENCY DEPARTMENT Provider Note   CSN: [id number] Arrival date & time: 12/24/18  1024     History   Chief Complaint Chief Complaint  Patient presents with  . Ankle Pain    HPI Angela Robertson is a 28 y.o. female with no known medical history presents to emergency department today with chief complaint of right ankle pain.  Patient states she was walking last night when she tripped on a curb causing her to invert her ankle.  She did not fall to the ground, friend was walking next to her and she was able to grab onto her arm to catch herself.  She has been able to ambulate and bear weight on right lower extremity but states it is painful.  She is describing the pain today as a dull ache.  She rates the pain 5 out of 10 in severity.  Pain is localized to her lateral ankle.  She is also reporting swelling to her ankle.  She has tried taking Tylenol with minimal symptom relief.  She admits to spraining the same ankle 4 years ago.  She did not follow up with orthopedics.  No past medical history on file.  There are no active problems to display for this patient.     OB History   No obstetric history on file.      Home Medications    Prior to Admission medications   Medication Sig Start Date End Date Taking? Authorizing Provider  naproxen (NAPROSYN) 500 MG tablet Take 1 tablet (500 mg total) by mouth 2 (two) times daily as needed for up to 14 days for moderate pain. 12/24/18 01/07/19  Albrizze, Caroleen Hamman, PA-C    Family History No family history on file.  Social History Social History   Tobacco Use  . Smoking status: Not on file  Substance Use Topics  . Alcohol use: Not on file  . Drug use: Not on file     Allergies   Patient has no known allergies.   Review of Systems Review of Systems  Constitutional: Negative for chills and fever.  Musculoskeletal: Positive for arthralgias and joint swelling. Negative for back pain, myalgias and  neck pain.  Skin: Negative for rash and wound.  Allergic/Immunologic: Negative for immunocompromised state.  Neurological: Negative for weakness and numbness.     Physical Exam Updated Vital Signs BP 138/90   Pulse 81   Temp 98.3 F (36.8 C) (Oral)   Resp 16   LMP 12/20/2018 (Approximate)   SpO2 100%   Physical Exam Vitals signs and nursing note reviewed.  Constitutional:      Appearance: She is well-developed. She is not ill-appearing or toxic-appearing.  HENT:     Head: Normocephalic and atraumatic.     Nose: Nose normal.  Eyes:     General: No scleral icterus.       Right eye: No discharge.        Left eye: No discharge.     Conjunctiva/sclera: Conjunctivae normal.  Neck:     Musculoskeletal: Normal range of motion.     Vascular: No JVD.  Cardiovascular:     Rate and Rhythm: Normal rate and regular rhythm.     Pulses: Normal pulses.     Heart sounds: Normal heart sounds.  Pulmonary:     Effort: Pulmonary effort is normal.     Breath sounds: Normal breath sounds.  Abdominal:     General: There is no distension.  Musculoskeletal: Normal range of  motion.     Comments: There is swelling and tenderness over the right lateral malleolus.No overt deformity. No tenderness over the medial aspect of the ankle. The fifth metatarsal is not tender. The ankle joint is intact without excessive opening on stressing. No tenderness or swelling of fore foot or calf. No break in skin. Good pedal pulse and cap refill of all toes. Wiggling toes without difficulty. Walks with antalgic gait  Skin:    General: Skin is warm and dry.  Neurological:     Mental Status: She is oriented to person, place, and time.     GCS: GCS eye subscore is 4. GCS verbal subscore is 5. GCS motor subscore is 6.     Comments: Fluent speech, no facial droop.  Psychiatric:        Behavior: Behavior normal.      ED Treatments / Results  Labs (all labs ordered are listed, but only abnormal results are  displayed) Labs Reviewed - No data to display  EKG None  Radiology Dg Ankle Complete Right  Result Date: 12/24/2018 CLINICAL DATA:  Pain and swelling. EXAM: RIGHT ANKLE - COMPLETE 3+ VIEW COMPARISON:  None FINDINGS: Swelling over the lateral malleolus and also over the medial midfoot, no signs of acute fracture or dislocation. IMPRESSION: Soft tissue swelling with no underlying bony abnormality. Electronically Signed   By: Zetta Bills M.D.   On: 12/24/2018 11:36    Procedures Procedures (including critical care time)  Medications Ordered in ED Medications - No data to display   Initial Impression / Assessment and Plan / ED Course  I have reviewed the triage vital signs and the nursing notes.  Pertinent labs & imaging results that were available during my care of the patient were reviewed by me and considered in my medical decision making (see chart for details).  Patient presents to the ED with complaints of pain to the right anke s/p trip with ankle inversion. Exam without obvious deformity or open wounds. ROM intact. Tender to palpation over lateral malleolus, no tenderness to medial malleolus, forefoot or fifth metatarsal.  NVI distally. Xray viewed by me is negative for fracture/dislocation. Therapeutic splint provided. PRICE and naproxen recommended.  Pt currently menstruating and declines pregnancy test. I discussed results, treatment plan, need for follow-up, and return precautions with the patient. Provided opportunity for questions, patient confirmed understanding and are in agreement with plan.    Portions of this note were generated with [employer]. Dictation errors may occur despite best attempts at proofreading.  Final Clinical Impressions(s) / ED Diagnoses   Final diagnoses:  Sprain of right ankle, unspecified ligament, initial encounter    ED Discharge Orders         Ordered    naproxen (NAPROSYN) 500 MG tablet  2 times daily PRN     12/24/18  1207           Cherre Robins, PA-C 12/24/18 1409    Isla Pence, MD 12/24/18 1435

## 2018-12-24 NOTE — Progress Notes (Signed)
Orthopedic Tech Progress Note Patient Details:  Angela Robertson April 07, 1990 [id number]  Ortho Devices Type of Ortho Device: ASO, Crutches Ortho Device/Splint Location: right Ortho Device/Splint Interventions: Application   Post Interventions Patient Tolerated: Well Instructions Provided: Care of device   [place] Pink 12/24/2018, 12:50 PM

## 2018-12-24 NOTE — ED Notes (Signed)
Pt in xray

## 2018-12-24 NOTE — Discharge Instructions (Addendum)
Your caregiver has diagnosed you as suffering from an ankle sprain. Ankle sprain occurs when the ligaments that hold the ankle joint together are stretched or torn. It may take 4 to 6 weeks to heal. ° °-Follow-up: Call orthopedic follow up today today or tomorrow to schedule followup appointment for recheck of ongoing ankle pain that can be canceled with a 24-48 hour notice if complete resolution of pain. ° °For Activity: Use crutches with non-weight bearing for the first few days. Then, you may walk on your ankle as the pain allows, or as instructed. Start gradually with weight bearing on the affected ankle. Once you can walk pain free, then try jogging. When you can run forwards, then you can try moving side-to-side. If you cannot walk without crutches in one week, you need a re-check. °SEEK IMMEDIATE MEDICAL ATTENTION IF: your toes are numb or tingling, appear gray or blue, or you have severe pain (also elevate leg and loosen splint). ° °

## 2019-06-17 ENCOUNTER — Emergency Department (HOSPITAL_COMMUNITY)
Admission: EM | Admit: 2019-06-17 | Discharge: 2019-06-18 | Disposition: A | Discharge status: Home / Self Care | Payer: Medicaid Other | Attending: Emergency Medicine | Admitting: Emergency Medicine

## 2019-06-17 ENCOUNTER — Other Ambulatory Visit: Payer: Self-pay

## 2019-06-17 ENCOUNTER — Encounter (HOSPITAL_COMMUNITY): Payer: Self-pay | Admitting: Emergency Medicine

## 2019-06-17 DIAGNOSIS — R109 Unspecified abdominal pain: Secondary | ICD-10-CM | POA: Insufficient documentation

## 2019-06-17 DIAGNOSIS — Z5321 Procedure and treatment not carried out due to patient leaving prior to being seen by health care provider: Secondary | ICD-10-CM | POA: Diagnosis not present

## 2019-06-17 DIAGNOSIS — Z20822 Contact with and (suspected) exposure to covid-19: Secondary | ICD-10-CM | POA: Diagnosis not present

## 2019-06-17 LAB — CBC
HCT: 34.1 % — ABNORMAL LOW (ref 36.0–46.0)
Hemoglobin: 10.3 g/dL — ABNORMAL LOW (ref 12.0–15.0)
MCH: 22.2 pg — ABNORMAL LOW (ref 26.0–34.0)
MCHC: 30.2 g/dL (ref 30.0–36.0)
MCV: 73.7 fL — ABNORMAL LOW (ref 80.0–100.0)
Platelets: 284 K/uL (ref 150–400)
RBC: 4.63 MIL/uL (ref 3.87–5.11)
RDW: 17.8 % — ABNORMAL HIGH (ref 11.5–15.5)
WBC: 9.5 K/uL (ref 4.0–10.5)
nRBC: 0 % (ref 0.0–0.2)

## 2019-06-17 LAB — URINALYSIS, ROUTINE W REFLEX MICROSCOPIC
Bilirubin Urine: NEGATIVE
Glucose, UA: NEGATIVE mg/dL
Hgb urine dipstick: NEGATIVE
Ketones, ur: NEGATIVE mg/dL
Leukocytes,Ua: NEGATIVE
Nitrite: NEGATIVE
Protein, ur: NEGATIVE mg/dL
Specific Gravity, Urine: 1.029 (ref 1.005–1.030)
pH: 5 (ref 5.0–8.0)

## 2019-06-17 LAB — I-STAT BETA HCG BLOOD, ED (MC, WL, AP ONLY): I-stat hCG, quantitative: <5 m[IU]/mL

## 2019-06-17 LAB — COMPREHENSIVE METABOLIC PANEL
ALT: 19 U/L (ref 0–44)
AST: 22 U/L (ref 15–41)
Albumin: 3.5 g/dL (ref 3.5–5.0)
Alkaline Phosphatase: 51 U/L (ref 38–126)
Anion gap: 7 (ref 5–15)
BUN: 7 mg/dL (ref 6–20)
CO2: 22 mmol/L (ref 22–32)
Calcium: 9.5 mg/dL (ref 8.9–10.3)
Chloride: 112 mmol/L — ABNORMAL HIGH (ref 98–111)
Creatinine, Ser: 0.86 mg/dL (ref 0.44–1.00)
GFR calc Af Amer: >60 mL/min (ref >60)
GFR calc non Af Amer: >60 mL/min (ref >60)
Glucose, Bld: 107 mg/dL — ABNORMAL HIGH (ref 70–99)
Potassium: 3.8 mmol/L (ref 3.5–5.1)
Sodium: 141 mmol/L (ref 135–145)
Total Bilirubin: 0.4 mg/dL (ref 0.3–1.2)
Total Protein: 7.2 g/dL (ref 6.5–8.1)

## 2019-06-17 LAB — LIPASE, BLOOD: Lipase: 30 U/L (ref 11–51)

## 2019-06-17 MED ORDER — ONDANSETRON 4 MG PO TBDP
4.0000 mg | ORAL_TABLET | Freq: Once | ORAL | Status: AC | PRN
  Administered 2019-06-17: 4 mg via ORAL
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
## 2019-06-18 ENCOUNTER — Emergency Department (HOSPITAL_COMMUNITY)
Admission: EM | Admit: 2019-06-18 | Discharge: 2019-06-18 | Disposition: A | Discharge status: Home / Self Care | Payer: Medicaid Other | Source: Home / Self Care | Attending: Emergency Medicine | Admitting: Emergency Medicine

## 2019-06-18 ENCOUNTER — Encounter (HOSPITAL_COMMUNITY): Payer: Self-pay

## 2019-06-18 ENCOUNTER — Other Ambulatory Visit: Payer: Self-pay

## 2019-06-18 DIAGNOSIS — R112 Nausea with vomiting, unspecified: Secondary | ICD-10-CM

## 2019-06-18 DIAGNOSIS — J301 Allergic rhinitis due to pollen: Secondary | ICD-10-CM

## 2019-06-18 DIAGNOSIS — R197 Diarrhea, unspecified: Secondary | ICD-10-CM

## 2019-06-18 LAB — SARS CORONAVIRUS 2 (TAT 6-24 HRS): SARS Coronavirus 2: NEGATIVE

## 2019-06-18 MED ORDER — ONDANSETRON 4 MG PO TBDP: 4.0000 mg | ORAL_TABLET | Freq: Three times a day (TID) | ORAL | 0 refills | Status: DC | PRN

## 2019-06-18 NOTE — ED Notes (Signed)
PT reports Diarrhea. x7 times last 24 Hrs. Pt also has a HA  And ABD pain.

## 2019-06-18 NOTE — Discharge Instructions (Addendum)
I recommend that you take cetirizine or similar antihistamines over-the-counter on a daily basis for your symptoms of allergies.  Also encourage you to use Flonase regularly.  These will help with her symptoms of congestion and itchy watery eyes.  I have prescribed you a course of Zofran ODT to take for your nausea symptoms, as needed.  It is important that you drink plenty of fluids to replace any fluids lost with your vomiting and loose stools.  Given that your symptoms of abdominal discomfort improved with Tylenol, encourage you to continue taking that, as needed.  Please return to the ED or seek immediate medical attention should you experience any new or worsening symptoms.

## 2019-06-18 NOTE — ED Triage Notes (Signed)
Patient complains of nausea and diarrhea with itchy eyes and congestion since helping a friend move. Patient states that family has the same.
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
## 2019-06-18 NOTE — ED Notes (Signed)
Pt called for room no answer x3 

## 2019-07-10 ENCOUNTER — Encounter (HOSPITAL_COMMUNITY): Payer: Self-pay | Admitting: Emergency Medicine

## 2019-07-10 ENCOUNTER — Emergency Department (HOSPITAL_COMMUNITY): Payer: Medicaid Other

## 2019-07-10 ENCOUNTER — Other Ambulatory Visit: Payer: Self-pay

## 2019-07-10 ENCOUNTER — Emergency Department (HOSPITAL_COMMUNITY)
Admission: EM | Admit: 2019-07-10 | Discharge: 2019-07-10 | Disposition: A | Discharge status: Home / Self Care | Payer: Medicaid Other | Attending: Emergency Medicine | Admitting: Emergency Medicine

## 2019-07-10 DIAGNOSIS — Z87891 Personal history of nicotine dependence: Secondary | ICD-10-CM | POA: Diagnosis not present

## 2019-07-10 DIAGNOSIS — Z79899 Other long term (current) drug therapy: Secondary | ICD-10-CM | POA: Insufficient documentation

## 2019-07-10 DIAGNOSIS — R079 Chest pain, unspecified: Secondary | ICD-10-CM | POA: Insufficient documentation

## 2019-07-10 HISTORY — DX: Iron deficiency anemia, unspecified: D50.9

## 2019-07-10 LAB — CBC
HCT: 35 % — ABNORMAL LOW (ref 36.0–46.0)
Hemoglobin: 10.5 g/dL — ABNORMAL LOW (ref 12.0–15.0)
MCH: 22.4 pg — ABNORMAL LOW (ref 26.0–34.0)
MCHC: 30 g/dL (ref 30.0–36.0)
MCV: 74.6 fL — ABNORMAL LOW (ref 80.0–100.0)
Platelets: 284 10*3/uL (ref 150–400)
RBC: 4.69 MIL/uL (ref 3.87–5.11)
RDW: 18.6 % — ABNORMAL HIGH (ref 11.5–15.5)
WBC: 6.5 10*3/uL (ref 4.0–10.5)
nRBC: 0 % (ref 0.0–0.2)

## 2019-07-10 LAB — TROPONIN I (HIGH SENSITIVITY)
Troponin I (High Sensitivity): <2 ng/L (ref <18)
Troponin I (High Sensitivity): 2 ng/L (ref <18)

## 2019-07-10 LAB — BASIC METABOLIC PANEL
Anion gap: 8 (ref 5–15)
BUN: 7 mg/dL (ref 6–20)
CO2: 20 mmol/L — ABNORMAL LOW (ref 22–32)
Calcium: 8.9 mg/dL (ref 8.9–10.3)
Chloride: 109 mmol/L (ref 98–111)
Creatinine, Ser: 0.64 mg/dL (ref 0.44–1.00)
GFR calc Af Amer: >60 mL/min (ref >60)
GFR calc non Af Amer: >60 mL/min (ref >60)
Glucose, Bld: 109 mg/dL — ABNORMAL HIGH (ref 70–99)
Potassium: 3.7 mmol/L (ref 3.5–5.1)
Sodium: 137 mmol/L (ref 135–145)

## 2019-07-10 LAB — I-STAT BETA HCG BLOOD, ED (MC, WL, AP ONLY): I-stat hCG, quantitative: <5 m[IU]/mL (ref <5)

## 2019-07-10 LAB — D-DIMER, QUANTITATIVE: D-Dimer, Quant: 0.96 ug/mL-FEU — ABNORMAL HIGH (ref 0.00–0.50)

## 2019-07-10 MED ORDER — SODIUM CHLORIDE 0.9% FLUSH: 3.0000 mL | Freq: Once | INTRAVENOUS | Status: DC

## 2019-07-10 NOTE — ED Triage Notes (Signed)
Pt reports pain to center of chest last night and this morning.  Denies pain at present.  Denies SOB, nausea and vomiting.  Taking Phentermine x 3 weeks.

## 2019-07-10 NOTE — ED Notes (Signed)
Pt was walking out of her ED room & spoke to this RN in the hallway that she needed to leave and could not wait any longer for results of her labs d/t her daughters birthday party & people were already at her house & she still needed to get the cake. This RN explained to pt that one of her labs was elevated that the Dr may want to get a scan done in regards to those results, she was also informed that it is up to her if she stays or leaves but that this RN recommends that she stays just to be on the safe side. Pt did leave knowing that her d-dimer was elevated & an explanation of what that means. Pt left before the Dr was able to discuss her results and the ct-scan that was ordered did not get performed.

## 2019-07-10 NOTE — ED Provider Notes (Signed)
[facility] EMERGENCY DEPARTMENT Provider Note   CSN: [id number] Arrival date & time: 07/10/19  1114     History Chief Complaint  Patient presents with  . Chest Pain    Angela Robertson is a 29 y.o. female.  Patient is a 29 y/o F presenting with chest pain. Patient reports central, sharp chest pain which started last night and lasted until this morning and is now resolved. Denies SOB, leg swelling, fever, chills. Reports she has had "sinus problems" with sneezing and coughing which did exacerbate the pain. She has also been taking phentermine for weight loss. Had recent labs by PMD a few days ago which were reassuring other than hemoglobin of 11.0. Pain is not exertional.         Past Medical History:  Diagnosis Date  . Iron deficiency anemia     There are no problems to display for this patient.   Past Surgical History:  Procedure Laterality Date  . BREAST SURGERY     Reduction  . CESAREAN SECTION       OB History   No obstetric history on file.     No family history on file.  Social History   Tobacco Use  . Smoking status: Former Games developer  . Smokeless tobacco: Never Used  Substance Use Topics  . Alcohol use: Not Currently  . Drug use: Not Currently    Home Medications Prior to Admission medications   Medication Sig Start Date End Date Taking? Authorizing Provider  ferrous sulfate 325 (65 FE) MG tablet Take 325 mg by mouth daily with breakfast.   Yes [provider]  amoxicillin (AMOXIL) 500 MG capsule Take 1 capsule by mouth 3 (three) times daily. 06/05/19   [provider]  ondansetron (ZOFRAN ODT) 4 MG disintegrating tablet Take 1 tablet (4 mg total) by mouth every 8 (eight) hours as needed for nausea or vomiting. Patient not taking: Reported on 07/10/2019 06/18/19   Lorelee New, PA-C    Allergies    Latex  Review of Systems   Review of Systems  Constitutional: Negative for chills and fever.  HENT: Positive for  sinus pain.   Eyes: Negative for visual disturbance.  Respiratory: Positive for cough. Negative for shortness of breath.   Cardiovascular: Positive for chest pain. Negative for palpitations.  Gastrointestinal: Negative for abdominal pain, nausea and vomiting.  Genitourinary: Negative for dysuria.  Musculoskeletal: Negative for back pain.  Skin: Negative for rash.  Neurological: Negative for dizziness.  Hematological: Does not bruise/bleed easily.    Physical Exam Updated Vital Signs BP (!) 136/97 (BP Location: Right Arm)   Pulse 89   Temp 98.4 F (36.9 C) (Oral)   Resp 16   Ht 5\' 2"  (1.575 m)   Wt 89.8 kg   LMP 06/21/2019   SpO2 100%   BMI 36.21 kg/m   Physical Exam Vitals and nursing note reviewed.  Constitutional:      General: She is not in acute distress.    Appearance: Normal appearance. She is well-developed. She is obese. She is not ill-appearing, toxic-appearing or diaphoretic.  HENT:     Head: Normocephalic.  Eyes:     Extraocular Movements: Extraocular movements intact.     Conjunctiva/sclera: Conjunctivae normal.  Cardiovascular:     Rate and Rhythm: Normal rate and regular rhythm.     Heart sounds: Normal heart sounds.  Pulmonary:     Effort: Pulmonary effort is normal.     Breath sounds: Normal breath  sounds.  Musculoskeletal:     Right lower leg: No edema.     Left lower leg: No edema.  Skin:    General: Skin is warm and dry.  Neurological:     Mental Status: She is alert and oriented to person, place, and time.  Psychiatric:        Mood and Affect: Mood normal.     ED Results / Procedures / Treatments   Labs (all labs ordered are listed, but only abnormal results are displayed) Labs Reviewed  BASIC METABOLIC PANEL - Abnormal; Notable for the following components:      Result Value   CO2 20 (*)    Glucose, Bld 109 (*)    All other components within normal limits  CBC - Abnormal; Notable for the following components:   Hemoglobin 10.5 (*)      HCT 35.0 (*)    MCV 74.6 (*)    MCH 22.4 (*)    RDW 18.6 (*)    All other components within normal limits  D-DIMER, QUANTITATIVE (NOT AT [facility]) - Abnormal; Notable for the following components:   D-Dimer, Quant 0.96 (*)    All other components within normal limits  I-STAT BETA HCG BLOOD, ED ([facility], [facility], [facility] ONLY)  TROPONIN I (HIGH SENSITIVITY)  TROPONIN I (HIGH SENSITIVITY)    EKG EKG Interpretation  Date/Time:  Sunday Jul 10 2019 11:12:38 EDT Ventricular Rate:  93 PR Interval:  162 QRS Duration: 82 QT Interval:  342 QTC Calculation: 425 R Axis:   26 Text Interpretation: Normal sinus rhythm Nonspecific T waves changes No STEMI Confirmed by Nanda Quinton [phone]) on 07/10/2019 11:46:49 AM   Radiology DG Chest Portable 1 View  Result Date: 07/10/2019 CLINICAL DATA:  Chest pain EXAM: PORTABLE CHEST 1 VIEW COMPARISON:  None. FINDINGS: The heart size and mediastinal contours are within normal limits given technique. Both lungs are clear. Fixation screws overlie the right glenoid. IMPRESSION: No active disease. Electronically Signed   By: Zerita Boers M.D.   On: 07/10/2019 12:20    Procedures Procedures (including critical care time)  Medications Ordered in ED Medications  sodium chloride flush (NS) 0.9 % injection 3 mL (0 mLs Intravenous Hold 07/10/19 1211)    ED Course  I have reviewed the triage vital signs and the nursing notes.  Pertinent labs & imaging results that were available during my care of the patient were reviewed by me and considered in my medical decision making (see chart for details).  Clinical Course as of Jul 10 1411  Sun Jul 10, 2019  1330 29 y/o f with CP since last night. Resolved on arrival. Well appearing with benign physical exam. Labs at patient baseline. EKG sinus rhythm.    [KM]  8469 When I went to re-evaluate the patient and discuss findings she was no longer in the room. Patient has negative trop but her ddimer was elevated to .96 . CTA was ordered  but patient is currently no longer in room.    [KM]  1413 Patient was made aware by nursing staff that her lab work was abnormal and further scans were likely needed. Patient insisted on leaving anyway and got herself dressed and walked out of ER despite being encouraged to stay.    [KM]    Clinical Course User Index [KM] Kristine Royal   MDM Rules/Calculators/A&P                      Patient  left prior to being able to perform CTA due to elevated DDIMER. Patient well appearing and of normal mental status Final Clinical Impression(s) / ED Diagnoses Final diagnoses:  None    Rx / DC Orders ED Discharge Orders    None       Arlyn Dunning, PA-C 07/10/19 1414    Maia Plan, MD 07/15/19 [phone]

## 2019-12-12 ENCOUNTER — Encounter (HOSPITAL_COMMUNITY): Payer: Self-pay | Admitting: Emergency Medicine

## 2019-12-12 ENCOUNTER — Emergency Department (HOSPITAL_COMMUNITY)
Admission: EM | Admit: 2019-12-12 | Discharge: 2019-12-13 | Disposition: A | Discharge status: Home / Self Care | Payer: Medicaid Other | Attending: Emergency Medicine | Admitting: Emergency Medicine

## 2019-12-12 DIAGNOSIS — U071 COVID-19: Secondary | ICD-10-CM | POA: Insufficient documentation

## 2019-12-12 DIAGNOSIS — R110 Nausea: Secondary | ICD-10-CM | POA: Insufficient documentation

## 2019-12-12 DIAGNOSIS — Z91040 Latex allergy status: Secondary | ICD-10-CM | POA: Insufficient documentation

## 2019-12-12 DIAGNOSIS — Z87891 Personal history of nicotine dependence: Secondary | ICD-10-CM | POA: Diagnosis not present

## 2019-12-12 DIAGNOSIS — R509 Fever, unspecified: Secondary | ICD-10-CM | POA: Diagnosis present

## 2019-12-12 LAB — RESPIRATORY PANEL BY RT PCR (FLU A&B, COVID)
Influenza A by PCR: NEGATIVE
Influenza B by PCR: NEGATIVE
SARS Coronavirus 2 by RT PCR: POSITIVE — AB

## 2019-12-12 MED ORDER — ONDANSETRON 4 MG PO TBDP: 4.0000 mg | ORAL_TABLET | Freq: Three times a day (TID) | ORAL | 0 refills | Status: DC | PRN

## 2019-12-12 MED ORDER — ACETAMINOPHEN 500 MG PO TABS
1000.0000 mg | ORAL_TABLET | Freq: Once | ORAL | Status: DC
  Filled 2019-12-12: qty 2

## 2019-12-12 MED ORDER — ONDANSETRON 4 MG PO TBDP
4.0000 mg | ORAL_TABLET | Freq: Once | ORAL | Status: AC
  Administered 2019-12-13: 4 mg via ORAL
  Filled 2019-12-12: qty 1

## 2019-12-12 NOTE — ED Provider Notes (Signed)
[facility] EMERGENCY DEPARTMENT Provider Note   CSN: [id number] Arrival date & time: 12/12/19  1205     History Chief Complaint  Patient presents with  . Fever    Angela Robertson is a 29 y.o. female.  The history is provided by the patient and medical records.    29 y.o. F with hx of Fe+ deficiency anemia, presenting to the ED for fever and body aches that began yesterday.  States her 75 year old daughter is sick with similar.  She reports dry cough, fatigue, altered taste/smell but not total loss.  Has had some nausea but able to eat/drink.  She has not been vaccinated for covid-19.  Has been taking tylenol for symptoms at home.  Past Medical History:  Diagnosis Date  . Iron deficiency anemia     There are no problems to display for this patient.   Past Surgical History:  Procedure Laterality Date  . BREAST SURGERY     Reduction  . CESAREAN SECTION       OB History   No obstetric history on file.     No family history on file.  Social History   Tobacco Use  . Smoking status: Former Games developer  . Smokeless tobacco: Never Used  Substance Use Topics  . Alcohol use: Not Currently  . Drug use: Not Currently    Home Medications Prior to Admission medications   Medication Sig Start Date End Date Taking? Authorizing Provider  amoxicillin (AMOXIL) 500 MG capsule Take 1 capsule by mouth 3 (three) times daily. 06/05/19   [provider]  ferrous sulfate 325 (65 FE) MG tablet Take 325 mg by mouth daily with breakfast.    [provider]  ondansetron (ZOFRAN ODT) 4 MG disintegrating tablet Take 1 tablet (4 mg total) by mouth every 8 (eight) hours as needed for nausea or vomiting. Patient not taking: Reported on 07/10/2019 06/18/19   Lorelee New, PA-C    Allergies    Latex  Review of Systems   Review of Systems  Constitutional: Positive for fever.  Respiratory: Positive for cough.   Musculoskeletal: Positive for myalgias.  All  other systems reviewed and are negative.   Physical Exam Updated Vital Signs BP 107/78 (BP Location: Right Arm)   Pulse (!) 20   Temp (!) 101.9 F (38.8 C) (Oral)   Resp (!) 101   Ht 5\' 2"  (1.575 m)   Wt 95.3 kg   SpO2 100%   BMI 38.41 kg/m   Physical Exam Vitals and nursing note reviewed.  Constitutional:      Appearance: She is well-developed.     Comments: Appears to feel poorly, non-toxic, NAD Warm to the touch  HENT:     Head: Normocephalic and atraumatic.  Eyes:     Conjunctiva/sclera: Conjunctivae normal.     Pupils: Pupils are equal, round, and reactive to light.  Cardiovascular:     Rate and Rhythm: Normal rate and regular rhythm.     Heart sounds: Normal heart sounds.  Pulmonary:     Effort: Pulmonary effort is normal. No respiratory distress.     Breath sounds: Normal breath sounds. No rhonchi.  Abdominal:     General: Bowel sounds are normal.     Palpations: Abdomen is soft.  Musculoskeletal:        General: Normal range of motion.     Cervical back: Normal range of motion.  Skin:    General: Skin is warm and dry.  Neurological:     Mental Status: She is alert and oriented to person, place, and time.     ED Results / Procedures / Treatments   Labs (all labs ordered are listed, but only abnormal results are displayed) Labs Reviewed  RESPIRATORY PANEL BY RT PCR (FLU A&B, COVID) - Abnormal; Notable for the following components:      Result Value   SARS Coronavirus 2 by RT PCR POSITIVE (*)    All other components within normal limits    EKG None  Radiology No results found.  Procedures Procedures (including critical care time)  Medications Ordered in ED Medications  acetaminophen (TYLENOL) tablet 1,000 mg (has no administration in time range)  ondansetron (ZOFRAN-ODT) disintegrating tablet 4 mg (4 mg Oral Given 12/13/19 0001)    ED Course  I have reviewed the triage vital signs and the nursing notes.  Pertinent labs & imaging results  that were available during my care of the patient were reviewed by me and considered in my medical decision making (see chart for details).    MDM Rules/Calculators/A&P  29 y.o. F here with fever, cough, body aches.  Daughter with similar and tested + for covid today.  She is febrile here but non-toxic in appearance.  Lungs grossly clear, NAD.  No active cough.  Denies chest pain or SOB.  Has had nausea but abdomen soft, benign.  covid test is positive today.  Patient given results, she acknowledged understanding.  Reviewed symptomatic care instructions for home.  She does meet criteria for MAB infusion based on BMI so referred to MAB infusion center.  Rx zofran, encouraged rest and good hydration at home.  Quarantine at home as best as possible.  Work note provided.  Follow-up with PCP and/or [place] [facility].  May return here for any new/acute changes.  Angela Robertson was evaluated in Emergency Department on 12/13/2019 for the symptoms described in the history of present illness. She was evaluated in the context of the global COVID-19 pandemic, which necessitated consideration that the patient might be at risk for infection with the SARS-CoV-2 virus that causes COVID-19. Institutional protocols and algorithms that pertain to the evaluation of patients at risk for COVID-19 are in a state of rapid change based on information released by regulatory bodies including the CDC and federal and state organizations. These policies and algorithms were followed during the patient's care in the ED.  Final Clinical Impression(s) / ED Diagnoses Final diagnoses:  COVID-19    Rx / DC Orders ED Discharge Orders         Ordered    ondansetron (ZOFRAN ODT) 4 MG disintegrating tablet  Every 8 hours PRN        12/12/19 2357           Garlon Hatchet, PA-C 12/13/19 0041    Dione Booze, MD 12/13/19 [phone]

## 2019-12-12 NOTE — ED Triage Notes (Signed)
Pt here for fever and body aches, pts daughter was just tested + for covid in peds.

## 2019-12-12 NOTE — Discharge Instructions (Addendum)
Take tylenol as needed for fever and/or body aches.  Do not exceed more than 2g tylenol daily. Make sure to rest, drink lots of fluids.  Quarantine away from others in home the best you can. Have sent your information to the [facility] clinic to see if you qualify for infusion to help with symptoms of covid. Follow-up with your primary care doctor or at the [place] [facility].

## 2019-12-13 ENCOUNTER — Telehealth (HOSPITAL_COMMUNITY): Payer: Self-pay

## 2019-12-13 NOTE — Telephone Encounter (Signed)
Called to Discuss with patient about Covid symptoms and the use of the monoclonal antibody infusion for those with mild to moderate Covid symptoms and at a high risk of hospitalization.     Pt appears to qualify for this infusion due to co-morbid conditions and/or a member of an at-risk group in accordance with the FDA Emergency Use Authorization.    Patient is going to do research on medication and going to give [place] a call back.

## 2019-12-14 ENCOUNTER — Telehealth: Payer: Self-pay | Admitting: Physician Assistant

## 2019-12-14 NOTE — Telephone Encounter (Signed)
  Called to discuss with patient about Covid symptoms and the use of  a monoclonal antibody infusion for those with mild to moderate Covid symptoms and at a high risk of hospitalization.    Patient will call us back if interested.   Manson Passey, PA - C

## 2020-04-11 ENCOUNTER — Other Ambulatory Visit: Payer: Self-pay

## 2020-04-11 ENCOUNTER — Encounter (HOSPITAL_COMMUNITY): Payer: Self-pay | Admitting: Emergency Medicine

## 2020-04-11 ENCOUNTER — Emergency Department (HOSPITAL_COMMUNITY)
Admission: EM | Admit: 2020-04-11 | Discharge: 2020-04-12 | Disposition: A | Discharge status: Home / Self Care | Payer: Medicaid Other | Attending: Emergency Medicine | Admitting: Emergency Medicine

## 2020-04-11 DIAGNOSIS — Y92410 Unspecified street and highway as the place of occurrence of the external cause: Secondary | ICD-10-CM | POA: Diagnosis not present

## 2020-04-11 DIAGNOSIS — Z91040 Latex allergy status: Secondary | ICD-10-CM | POA: Insufficient documentation

## 2020-04-11 DIAGNOSIS — M791 Myalgia, unspecified site: Secondary | ICD-10-CM | POA: Diagnosis not present

## 2020-04-11 DIAGNOSIS — R519 Headache, unspecified: Secondary | ICD-10-CM | POA: Diagnosis present

## 2020-04-11 DIAGNOSIS — Z87891 Personal history of nicotine dependence: Secondary | ICD-10-CM | POA: Diagnosis not present
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
## 2020-04-11 NOTE — ED Notes (Signed)
Pt stated she would check back in later, after her kids were seen on the peds side

## 2020-04-11 NOTE — ED Notes (Signed)
Called pt for triage, no answer x2

## 2020-04-12 MED ORDER — METHOCARBAMOL 500 MG PO TABS
500.0000 mg | ORAL_TABLET | Freq: Two times a day (BID) | ORAL | 0 refills | Status: DC
Start: 2020-04-12 — End: 2021-01-01

## 2020-04-12 MED ORDER — NAPROXEN 250 MG PO TABS
500.0000 mg | ORAL_TABLET | Freq: Once | ORAL | Status: AC
  Administered 2020-04-12: 500 mg via ORAL
  Filled 2020-04-12: qty 2

## 2020-04-12 MED ORDER — NAPROXEN 500 MG PO TABS
500.0000 mg | ORAL_TABLET | Freq: Two times a day (BID) | ORAL | 0 refills | Status: DC
Start: 2020-04-12 — End: 2021-01-01

## 2020-04-12 MED ORDER — METHOCARBAMOL 500 MG PO TABS
500.0000 mg | ORAL_TABLET | Freq: Once | ORAL | Status: AC
  Administered 2020-04-12: 500 mg via ORAL
  Filled 2020-04-12: qty 1

## 2020-04-12 NOTE — Discharge Instructions (Addendum)
Take the prescribed medication as directed-- sent to pharmacy for you already.  Can consider using heat/ice on affected areas as well. Soreness should improve over the next few days. Follow-up with your primary care doctor. Return to the ED for new or worsening symptoms.

## 2020-04-12 NOTE — ED Notes (Signed)
E-signature pad unavailable at time of pt discharge. This RN discussed discharge materials with pt and answered all pt questions. Pt stated understanding of discharge material. ? ?
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
## 2021-01-01 ENCOUNTER — Other Ambulatory Visit (INDEPENDENT_AMBULATORY_CARE_PROVIDER_SITE_OTHER): Payer: Self-pay | Admitting: Primary Care

## 2021-01-01 ENCOUNTER — Ambulatory Visit (INDEPENDENT_AMBULATORY_CARE_PROVIDER_SITE_OTHER): Payer: Medicaid Other | Admitting: Primary Care

## 2021-01-01 ENCOUNTER — Encounter (INDEPENDENT_AMBULATORY_CARE_PROVIDER_SITE_OTHER): Payer: Self-pay | Admitting: Primary Care

## 2021-01-01 ENCOUNTER — Other Ambulatory Visit: Payer: Self-pay

## 2021-01-01 VITALS — BP 122/84 | HR 96 | Temp 97.7°F | Ht 62.0 in | Wt 198.4 lb

## 2021-01-01 DIAGNOSIS — G4733 Obstructive sleep apnea (adult) (pediatric): Secondary | ICD-10-CM | POA: Diagnosis not present

## 2021-01-01 DIAGNOSIS — D508 Other iron deficiency anemias: Secondary | ICD-10-CM

## 2021-01-01 DIAGNOSIS — Z833 Family history of diabetes mellitus: Secondary | ICD-10-CM | POA: Diagnosis not present

## 2021-01-01 DIAGNOSIS — R519 Headache, unspecified: Secondary | ICD-10-CM

## 2021-01-01 DIAGNOSIS — Z8619 Personal history of other infectious and parasitic diseases: Secondary | ICD-10-CM

## 2021-01-01 DIAGNOSIS — Z6839 Body mass index (BMI) 39.0-39.9, adult: Secondary | ICD-10-CM

## 2021-01-01 LAB — POCT GLYCOSYLATED HEMOGLOBIN (HGB A1C): Hemoglobin A1C: 5.6 % (ref 4.0–5.6)

## 2021-01-01 MED ORDER — DOCOSANOL 10 % EX CREA: 2.0000 g | TOPICAL_CREAM | CUTANEOUS | 1 refills | Status: DC | PRN

## 2021-01-01 NOTE — Progress Notes (Signed)
 [facility] New Patient Office Visit  Subjective:  Patient ID: Angela Robertson, female    DOB: 01/04/1991  Age: 30 y.o. MRN: [id number]  CC:  Chief Complaint  Patient presents with   New Patient (Initial Visit)    anemia   Mouth Lesions    HPI [name] is a 30-year-old obese female presents for establishment of care transferring care from [facility].  Her main concern is she has bouts of cold inside on her lipids (mouth) and now  her nose is developing sores.  She is a mouth breather breather and snores.  She admits to severe snoring with episodes of apnea. Denies shortness of breath, chest pain or lower extremity edema .  She has had a headache for the last 2 weeks temporal to temporal admits to drinking nothing but sodas at least 5 daily.  She does not drink coffee, cigarette smoker, occasionally have social drinking.  Past Medical History:  Diagnosis Date   Iron deficiency anemia     Past Surgical History:  Procedure Laterality Date   BREAST SURGERY     Reduction   CESAREAN SECTION      History reviewed. No pertinent family history.  Social History   Socioeconomic History   Marital status: Married    Spouse name: Not on file   Number of children: Not on file   Years of education: Not on file   Highest education level: Not on file  Occupational History   Not on file  Tobacco Use   Smoking status: Former   Smokeless tobacco: Never  Substance and Sexual Activity   Alcohol use: Not Currently   Drug use: Not Currently   Sexual activity: Not on file  Other Topics Concern   Not on file  Social History Narrative   Not on file   Social Determinants of Health   Financial Resource Strain: Not on file  Food Insecurity: Not on file  Transportation Needs: Not on file  Physical Activity: Not on file  Stress: Not on file  Social Connections: Not on file  Intimate Partner Violence: Not on file    ROS Review of Systems  HENT:         Cold  sores  Neurological:  Positive for headaches.   Objective:   Today's Vitals: BP 122/84 (BP Location: Right Arm, Patient Position: Sitting, Cuff Size: Large)   Pulse 96   Temp 97.7 F (36.5 C) (Temporal)   Ht 5' 2" (1.575 m)   Wt 198 lb 6.4 oz (90 kg)   LMP 12/27/2020 (Exact Date)   SpO2 98%   BMI 36.29 kg/m   Physical exam: General: Vital signs reviewed.  Patient is well-developed and well-nourished, obese female  in no acute distress and cooperative with exam. Head: Normocephalic and atraumatic. Eyes: EOMI, conjunctivae normal, no scleral icterus. Neck: Supple, trachea midline, normal ROM, no JVD, masses, thyromegaly, or carotid bruit present. Cardiovascular: RRR, S1 normal, S2 normal, no murmurs, gallops, or rubs. Pulmonary/Chest: Clear to auscultation bilaterally, no wheezes, rales, or rhonchi. Abdominal: Soft, non-tender, non-distended, BS +, no masses, organomegaly, or guarding present. Musculoskeletal: No joint deformities, erythema, or stiffness, ROM full and nontender. Extremities: No lower extremity edema bilaterally,  pulses symmetric and intact bilaterally. No cyanosis or clubbing. Neurological: A&O x3, Strength is normal Skin: Warm, dry and intact. No rashes or erythema. Psychiatric: Normal mood and affect. speech and behavior is normal. Cognition and memory are normal.    Assessment & Plan:  Angela Robertson was   seen today for new patient (initial visit) and mouth lesions.  Diagnoses and all orders for this visit:  Family history of diabetes mellitus in father -     HgB A1c 5.6 discussed prediabetes 5.7-6.4 and the need to monitor carbs and weight esp with hx of gestational diabetes and family hx. Patient voices understanding  Other iron deficiency anemia Anemia is a condition in which you lack enough healthy red blood cells to carry adequate oxygen to your body's tissues. Having anemia can make you feel tired and weak. Anemia can be temporary or long term, and it can  range from mild to severe  -     CBC with Differential -     CMP14+EGFR -     Iron, TIBC and Ferritin Panel  Class 2 severe obesity due to excess calories with serious comorbidity in adult, unspecified BMI (HCC) Obesity is 30-39 indicating an excess in caloric intake or underlining conditions. This may lead to other co-morbidities. Lifestyle modifications of diet and exercise may reduce obesity.   -     CMP14+EGFR -     Lipid Panel  OSA (obstructive sleep apnea) [name], [name] Nasal obstruction is not present.  Patient did not have a  tonsillectomy.    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 Triggers can be caused by drinking alcohol smoking or certain medications, eating and drinking certain products  This condition may be triggered or caused by: Caffeine, aged cheese, and chocolate. Encourage her to decrease caffeine intake. Gradually- try to reduce to 1 daily.   Outpatient Encounter Medications as of 01/01/2021  Medication Sig   Docosanol (ABREVA) 10 % CREA Apply 2 g topically as needed.   [DISCONTINUED] amoxicillin (AMOXIL) 500 MG capsule Take 1 capsule by mouth 3 (three) times daily.   [DISCONTINUED] ferrous sulfate 325 (65 FE) MG tablet Take 325 mg by mouth daily with breakfast.   [DISCONTINUED] methocarbamol (ROBAXIN) 500 MG tablet Take 1 tablet (500 mg total) by mouth 2 (two) times daily.   [DISCONTINUED] naproxen (NAPROSYN) 500 MG tablet Take 1 tablet (500 mg total) by mouth 2 (two) times daily with a meal.    [DISCONTINUED] ondansetron (ZOFRAN ODT) 4 MG disintegrating tablet Take 1 tablet (4 mg total) by mouth every 8 (eight) hours as needed for nausea.   No facility-administered encounter medications on file as of 01/01/2021.    Follow-up: Return in about 3 months (around 04/03/2021) for weight management .   Michelle P Edwards, NP  

## 2021-01-01 NOTE — Addendum Note (Signed)
Addended by: Gwinda Passe on: 01/01/2021 10:56 AM   Modules accepted: Orders

## 2021-01-01 NOTE — Patient Instructions (Signed)

## 2021-01-02 LAB — IRON,TIBC AND FERRITIN PANEL
Ferritin: 24 ng/mL (ref 15–150)
Iron Saturation: 4 % — CL (ref 15–55)
Iron: 15 ug/dL — ABNORMAL LOW (ref 27–159)
Total Iron Binding Capacity: 356 ug/dL (ref 250–450)
UIBC: 341 ug/dL (ref 131–425)

## 2021-01-02 LAB — CMP14+EGFR
ALT: 12 IU/L (ref 0–32)
AST: 15 IU/L (ref 0–40)
Albumin/Globulin Ratio: 1.1 — ABNORMAL LOW (ref 1.2–2.2)
Albumin: 3.9 g/dL (ref 3.8–4.8)
Alkaline Phosphatase: 55 IU/L (ref 44–121)
BUN/Creatinine Ratio: 8 — ABNORMAL LOW (ref 9–23)
BUN: 6 mg/dL (ref 6–20)
Bilirubin Total: 0.2 mg/dL (ref 0.0–1.2)
CO2: 24 mmol/L (ref 20–29)
Calcium: 9.2 mg/dL (ref 8.7–10.2)
Chloride: 105 mmol/L (ref 96–106)
Creatinine, Ser: 0.71 mg/dL (ref 0.57–1.00)
Globulin, Total: 3.4 g/dL (ref 1.5–4.5)
Glucose: 110 mg/dL — ABNORMAL HIGH (ref 70–99)
Potassium: 3.8 mmol/L (ref 3.5–5.2)
Sodium: 141 mmol/L (ref 134–144)
Total Protein: 7.3 g/dL (ref 6.0–8.5)
eGFR: 116 mL/min/{1.73_m2} (ref >59)

## 2021-01-02 LAB — LIPID PANEL
Chol/HDL Ratio: 4.6 ratio — ABNORMAL HIGH (ref 0.0–4.4)
Cholesterol, Total: 139 mg/dL (ref 100–199)
HDL: 30 mg/dL — ABNORMAL LOW (ref >39)
LDL Chol Calc (NIH): 92 mg/dL (ref 0–99)
Triglycerides: 85 mg/dL (ref 0–149)
VLDL Cholesterol Cal: 17 mg/dL (ref 5–40)

## 2021-01-02 LAB — CBC WITH DIFFERENTIAL/PLATELET
Basophils Absolute: 0 10*3/uL (ref 0.0–0.2)
Basos: 0 %
EOS (ABSOLUTE): 0.1 10*3/uL (ref 0.0–0.4)
Eos: 1 %
Hematocrit: 33.6 % — ABNORMAL LOW (ref 34.0–46.6)
Hemoglobin: 10 g/dL — ABNORMAL LOW (ref 11.1–15.9)
Immature Grans (Abs): 0 10*3/uL (ref 0.0–0.1)
Immature Granulocytes: 0 %
Lymphocytes Absolute: 2.5 10*3/uL (ref 0.7–3.1)
Lymphs: 36 %
MCH: 21.9 pg — ABNORMAL LOW (ref 26.6–33.0)
MCHC: 29.8 g/dL — ABNORMAL LOW (ref 31.5–35.7)
MCV: 74 fL — ABNORMAL LOW (ref 79–97)
Monocytes Absolute: 0.6 10*3/uL (ref 0.1–0.9)
Monocytes: 8 %
Neutrophils Absolute: 3.9 10*3/uL (ref 1.4–7.0)
Neutrophils: 55 %
Platelets: 393 10*3/uL (ref 150–450)
RBC: 4.56 x10E6/uL (ref 3.77–5.28)
RDW: 17.9 % — ABNORMAL HIGH (ref 11.7–15.4)
WBC: 7.1 10*3/uL (ref 3.4–10.8)

## 2021-01-02 LAB — TSH+FREE T4
Free T4: 1.45 ng/dL (ref 0.82–1.77)
TSH: 0.686 u[IU]/mL (ref 0.450–4.500)

## 2021-01-03 ENCOUNTER — Ambulatory Visit: Payer: Self-pay | Admitting: *Deleted

## 2021-01-03 LAB — HERPES SIMPLEX VIRUS CULTURE

## 2021-01-03 NOTE — Telephone Encounter (Signed)
Please address results so that patient can be contacted to review.

## 2021-01-03 NOTE — Telephone Encounter (Signed)
Patient returning call for lab results but no provider note commenting on results found on latest 01/01/10 lab results.  Please call patient with results and provider's comments. # on file is best. Thank you.     Reason for Disposition  [1] Caller requesting NON-URGENT health information AND [2] PCP's office is the best resource  Answer Assessment - Initial Assessment Questions 1. REASON FOR CALL or QUESTION: "What is your reason for calling today?" or "How can I best help you?" or "What question do you have that I can help answer?"     Results only  Protocols used: Information Only Call - No Triage-A-AH

## 2021-01-04 ENCOUNTER — Other Ambulatory Visit (INDEPENDENT_AMBULATORY_CARE_PROVIDER_SITE_OTHER): Payer: Self-pay | Admitting: Primary Care

## 2021-01-04 MED ORDER — FERROUS SULFATE 325 (65 FE) MG PO TABS: 325.0000 mg | ORAL_TABLET | Freq: Every day | ORAL | 3 refills | Status: DC

## 2021-01-05 ENCOUNTER — Other Ambulatory Visit (INDEPENDENT_AMBULATORY_CARE_PROVIDER_SITE_OTHER): Payer: Self-pay | Admitting: Primary Care

## 2021-01-05 MED ORDER — VALACYCLOVIR HCL 1 G PO TABS
1000.0000 mg | ORAL_TABLET | Freq: Two times a day (BID) | ORAL | 0 refills | Status: AC
Start: 2021-01-05 — End: 2021-01-15

## 2021-04-03 ENCOUNTER — Encounter (INDEPENDENT_AMBULATORY_CARE_PROVIDER_SITE_OTHER): Payer: Self-pay | Admitting: Primary Care

## 2021-04-03 ENCOUNTER — Other Ambulatory Visit: Payer: Self-pay

## 2021-04-03 ENCOUNTER — Ambulatory Visit (INDEPENDENT_AMBULATORY_CARE_PROVIDER_SITE_OTHER): Payer: Medicaid Other | Admitting: Primary Care

## 2021-04-03 VITALS — BP 119/86 | HR 86 | Temp 97.5°F | Ht 63.0 in | Wt 199.4 lb

## 2021-04-03 DIAGNOSIS — D508 Other iron deficiency anemias: Secondary | ICD-10-CM | POA: Diagnosis not present

## 2021-04-03 DIAGNOSIS — Z7689 Persons encountering health services in other specified circumstances: Secondary | ICD-10-CM | POA: Diagnosis not present

## 2021-04-03 MED ORDER — PHENTERMINE HCL 37.5 MG PO CAPS: 37.5000 mg | ORAL_CAPSULE | ORAL | 1 refills | Status: DC

## 2021-04-03 NOTE — Patient Instructions (Signed)
Phentermine Capsules or Tablets °What is this medication? °PHENTERMINE (FEN ter meen) promotes weight loss. It works by decreasing appetite. It is often used for a short period of time. Changes to diet and exercise are often combined with this medication. °This medicine may be used for other purposes; ask your health care provider or pharmacist if you have questions. °COMMON BRAND NAME(S): Adipex-P, Atti-Plex P, Atti-Plex P Spansule, Fastin, Lomaira, Pro-Fast, Pro-Fast HS, Pro-Fast SA, Tara-8 °What should I tell my care team before I take this medication? °They need to know if you have any of these conditions: °Agitation or nervousness °Diabetes °Glaucoma °Heart disease °High blood pressure °History of drug abuse or addiction °History of stroke °Kidney disease °Lung disease called Primary Pulmonary Hypertension (PPH) °Taken an MAOI like Carbex, Eldepryl, Marplan, Nardil, or Parnate in last 14 days °Taking stimulant medications for attention disorders, weight loss, or to stay awake °Thyroid disease °An unusual or allergic reaction to phentermine, other medications, foods, dyes, or preservatives °Pregnant or trying to get pregnant °Breast-feeding °How should I use this medication? °Take this medication by mouth with a glass of water. Follow the directions on the prescription label. Take your medication at regular intervals. Do not take it more often than directed. Do not stop taking except on your care team's advice. °Talk to your care team about the use of this medication in children. While this medication may be prescribed for children 17 years or older for selected conditions, precautions do apply. °Overdosage: If you think you have taken too much of this medicine contact a poison control center or emergency room at once. °NOTE: This medicine is only for you. Do not share this medicine with others. °What if I miss a dose? °If you miss a dose, take it as soon as you can. If it is almost time for your next dose, take  only that dose. Do not take double or extra doses. °What may interact with this medication? °Do not take this medication with any of the following: °MAOIs like Carbex, Eldepryl, Marplan, Nardil, and Parnate °This medication may also interact with the following: °Alcohol °Certain medications for depression, anxiety, or psychotic disorders °Certain medications for high blood pressure °Linezolid °Medications for colds or breathing difficulties like pseudoephedrine or phenylephrine °Medications for diabetes °Sibutramine °Stimulant medications for attention disorders, weight loss, or to stay awake °This list may not describe all possible interactions. Give your health care provider a list of all the medicines, herbs, non-prescription drugs, or dietary supplements you use. Also tell them if you smoke, drink alcohol, or use illegal drugs. Some items may interact with your medicine. °What should I watch for while using this medication? °Visit your care team for regular checks on your progress. °Do not stop taking except on your care team's advice. You may develop a severe reaction. Your care team will tell you how much medication to take. °Do not take this medication close to bedtime. It may prevent you from sleeping. °You may get drowsy or dizzy. Do not drive, use machinery, or do anything that needs mental alertness until you know how this medication affects you. Do not stand or sit up quickly, especially if you are an older patient. This reduces the risk of dizzy or fainting spells. Alcohol may increase dizziness and drowsiness. Avoid alcoholic drinks. °This medication may affect blood sugar levels. Ask your care team if changes in diet or medications are needed if you have diabetes. °Women should inform their care team if they wish to become   pregnant or think they might be pregnant. Losing weight while pregnant is not advised and may cause harm to the unborn child. Talk to your care team for more information. °What side  effects may I notice from receiving this medication? °Side effects that you should report to your care team as soon as possible: °Allergic reactions--skin rash, itching, hives, swelling of the face, lips, tongue, or throat °Heart failure--shortness of breath, swelling of the ankles, feet, or hands, sudden weight gain, unusual weakness or fatigue °Pulmonary hypertension--shortness of breath, chest pain, fast or irregular heartbeat, feeling faint or lightheaded, fatigue, swelling of the ankles or feet °Side effects that usually do not require medical attention (report to your care team if they continue or are bothersome): °Change in taste °Diarrhea °Dizziness °Dry mouth °Restlessness °Trouble sleeping °This list may not describe all possible side effects. Call your doctor for medical advice about side effects. You may report side effects to FDA at 1-800-FDA-1088. °Where should I keep my medication? °Keep out of the reach of children. This medication can be abused. Keep your medication in a safe place to protect it from theft. Do not share this medication with anyone. Selling or giving away this medication is dangerous and against the law. °This medication may cause harm and death if it is taken by other adults, children, or pets. Return medication that has not been used to an official disposal site. Contact the DEA at 1-800-882-9539 or your city/county government to find a site. If you cannot return the medication, mix any unused medication with a substance like cat litter or coffee grounds. Then throw the medication away in a sealed container like a sealed bag or coffee can with a lid. Do not use the medication after the expiration date. °Store at room temperature between 20 and 25 degrees C (68 and 77 degrees F). Keep container tightly closed. °NOTE: This sheet is a summary. It may not cover all possible information. If you have questions about this medicine, talk to your doctor, pharmacist, or health care  provider. °© 2022 Elsevier/Gold Standard (2020-11-06 00:00:00) ° °

## 2021-04-03 NOTE — Progress Notes (Signed)
[facility]   [name], is a 31 y.o. obese female presents for weight management through the holidays she only gained 1 pound due to watching intake and exercising. She had been previously on phentermine and lost 30 lbs in 3 months. She would like to go back on this medication. Patient states they have improved meal pattern, less frequent dining out, decreased fat intake, fewer sweetened foods & beverages, and increased physical activity.  BP Readings from Last 3 Encounters:  04/03/21 119/86  01/01/21 122/84  04/11/20 131/85    Wt Readings from Last 3 Encounters:  04/03/21 199 lb 6.4 oz (90.4 kg)  01/01/21 198 lb 6.4 oz (90 kg)  12/12/19 210 lb (95.3 kg)    Typical breakfast:bkf sandwich, fruit Typical lunch: snacker  Typical dinner:anything  Medications: Current Outpatient Medications on File Prior to Visit  Medication Sig Dispense Refill   Docosanol (ABREVA) 10 % CREA Apply 2 g topically as needed. (Patient not taking: Reported on 04/03/2021) 2 g 1   ferrous sulfate 325 (65 FE) MG tablet Take 1 tablet (325 mg total) by mouth daily with breakfast. (Patient not taking: Reported on 04/03/2021) 90 tablet 3   No current facility-administered medications on file prior to visit.    ROS:   Denies any headaches, blurred vision, fatigue, shortness of breath, chest pain, abdominal pain, abnormal vaginal discharge/itching/odor/irritation, problems with periods, bowel movements, urination, or intercourse unless otherwise stated above.  Physical exam:  Vitals:   04/03/21 0836  BP: 119/86  Pulse: 86  Temp: (!) 97.5 F (36.4 C)  SpO2: 98%   BP 119/86 (BP Location: Right Arm, Patient Position: Sitting, Cuff Size: Large)    Pulse 86    Temp (!) 97.5 F (36.4 C) (Oral)    Ht 5\' 3"  (1.6 m)    Wt 199 lb 6.4 oz (90.4 kg)    LMP 03/27/2021 (Exact Date)    SpO2 98%    BMI 35.32 kg/m  General appearance: alert, cooperative, appears stated age, and moderately  obese Head: Normocephalic, without obvious abnormality, atraumatic Ears: normal TM's and external ear canals both ears Nose: Nares normal. Septum midline. Mucosa normal. No drainage or sinus tenderness. Back: symmetric, no curvature. ROM normal. No CVA tenderness. Lungs: clear to auscultation bilaterally Heart: regular rate and rhythm, S1, S2 normal, no murmur, click, rub or gallop Abdomen: soft, non-tender; bowel sounds normal; no masses,  no organomegaly Extremities: extremities normal, atraumatic, no cyanosis or edema Pulses: 2+ and symmetric Skin: Skin color, texture, turgor normal. No rashes or lesions Neurologic: Alert and oriented X 3, normal strength and tone. Normal symmetric reflexes. Normal coordination and gait  [name] was seen today for weight management .  Diagnoses and all orders for this visit:  Other iron deficiency anemia Does not consistently take FeSO4 due to constipation advised to take OTC laxative  -     CBC with Differential  Class 2 severe obesity due to excess calories with serious comorbidity in adult, unspecified BMI (HCC) Obesity is 30-39 indicating an excess in caloric intake or underlining conditions. This may lead to other co-morbidities. Lifestyle modifications of diet and exercise may reduce obesity.   -     phentermine 37.5 MG capsule; Take 1 capsule (37.5 mg total) by mouth every morning.  Encounter for weight management Obesity with co morbid conditions.  General weight loss/lifestyle modification strategies discussed (elicit support from others; identify saboteurs; non-food rewards, etc). Diet interventions:  qualitative changes (increase low-fat,  high-fiber foods). Medication: phentermine. Follow up in: 1 month This note has been created with Education officer, environmental. Any transcriptional errors are unintentional.   Grayce Sessions, NP 04/03/2021, 8:47 AM

## 2021-04-04 LAB — CBC WITH DIFFERENTIAL/PLATELET
Basophils Absolute: 0 10*3/uL (ref 0.0–0.2)
Basos: 0 %
EOS (ABSOLUTE): 0.1 10*3/uL (ref 0.0–0.4)
Eos: 1 %
Hematocrit: 33.1 % — ABNORMAL LOW (ref 34.0–46.6)
Hemoglobin: 9.7 g/dL — ABNORMAL LOW (ref 11.1–15.9)
Immature Grans (Abs): 0 10*3/uL (ref 0.0–0.1)
Immature Granulocytes: 0 %
Lymphocytes Absolute: 3.3 10*3/uL — ABNORMAL HIGH (ref 0.7–3.1)
Lymphs: 44 %
MCH: 21.1 pg — ABNORMAL LOW (ref 26.6–33.0)
MCHC: 29.3 g/dL — ABNORMAL LOW (ref 31.5–35.7)
MCV: 72 fL — ABNORMAL LOW (ref 79–97)
Monocytes Absolute: 0.5 10*3/uL (ref 0.1–0.9)
Monocytes: 7 %
Neutrophils Absolute: 3.6 10*3/uL (ref 1.4–7.0)
Neutrophils: 48 %
Platelets: 387 10*3/uL (ref 150–450)
RBC: 4.6 x10E6/uL (ref 3.77–5.28)
RDW: 19.1 % — ABNORMAL HIGH (ref 11.7–15.4)
WBC: 7.5 10*3/uL (ref 3.4–10.8)

## 2021-04-22 ENCOUNTER — Institutional Professional Consult (permissible substitution): Payer: Medicaid Other | Admitting: Pulmonary Disease

## 2021-04-23 ENCOUNTER — Encounter: Payer: Medicaid Other | Admitting: Primary Care

## 2021-04-23 NOTE — Progress Notes (Signed)
° °@  Patient ID: Angela Robertson, female    DOB: May 15, 1990, 31 y.o.   MRN: [id number]  No chief complaint on file.   Referring provider: Grayce Sessions, NP  HPI: 31 year old female, former smoker.  Past medical history significant for obesity, iron deficiency anemia.  04/23/2021 Patient referred today for sleep consult by PCP due to loud snoring, witnessed apnea, daytime sleepiness and morning headaches. She has difficulty initiating and maintaining sleep. She gets an average she gets 4 to 5 hours of sleep per night.    Sleep questionnaire Symptoms Previous sleep studies Bedtime Time to fall asleep Nocturnal awakenings Out of bed Weight changes [place]   Allergies  Allergen Reactions   Peach [Prunus Persica] Anaphylaxis   Latex      There is no immunization history on file for this patient.  Past Medical History:  Diagnosis Date   Iron deficiency anemia     Tobacco History: Social History   Tobacco Use  Smoking Status Former  Smokeless Tobacco Never   Counseling given: Not Answered   Outpatient Medications Prior to Visit  Medication Sig Dispense Refill   Docosanol (ABREVA) 10 % CREA Apply 2 g topically as needed. (Patient not taking: Reported on 04/03/2021) 2 g 1   ferrous sulfate 325 (65 FE) MG tablet Take 1 tablet (325 mg total) by mouth daily with breakfast. (Patient not taking: Reported on 04/03/2021) 90 tablet 3   phentermine 37.5 MG capsule Take 1 capsule (37.5 mg total) by mouth every morning. 30 capsule 1   No facility-administered medications prior to visit.      Review of Systems  Review of Systems   Physical Exam  LMP 03/27/2021 (Exact Date)  Physical Exam   Lab Results:  CBC    Component Value Date/Time   WBC 7.5 04/03/2021 0910   WBC 6.5 07/10/2019 1125   RBC 4.60 04/03/2021 0910   RBC 4.69 07/10/2019 1125   HGB 9.7 (L) 04/03/2021 0910   HCT 33.1 (L) 04/03/2021 0910   PLT 387 04/03/2021 0910   MCV 72 (L) 04/03/2021 0910    MCH 21.1 (L) 04/03/2021 0910   MCH 22.4 (L) 07/10/2019 1125   MCHC 29.3 (L) 04/03/2021 0910   MCHC 30.0 07/10/2019 1125   RDW 19.1 (H) 04/03/2021 0910   LYMPHSABS 3.3 (H) 04/03/2021 0910   EOSABS 0.1 04/03/2021 0910   BASOSABS 0.0 04/03/2021 0910    BMET    Component Value Date/Time   NA 141 01/01/2021 1000   K 3.8 01/01/2021 1000   CL 105 01/01/2021 1000   CO2 24 01/01/2021 1000   GLUCOSE 110 (H) 01/01/2021 1000   GLUCOSE 109 (H) 07/10/2019 1125   BUN 6 01/01/2021 1000   CREATININE 0.71 01/01/2021 1000   CALCIUM 9.2 01/01/2021 1000   GFRNONAA >60 07/10/2019 1125   GFRAA >60 07/10/2019 1125    BNP No results found for: BNP  ProBNP No results found for: PROBNP  Imaging: No results found.   Assessment & Plan:   No problem-specific Assessment & Plan notes found for this encounter.     Glenford Bayley, NP 04/23/2021

## 2021-04-23 NOTE — Patient Instructions (Signed)
Risks of untreated sleep apnea include cardiac arrhythmias, stroke, pulmonary hypertension and diabetes  Treatment options include weight loss, side sleeping position, oral appliance, CPAP therapy or referral to ENT for possible surgical options

## 2021-05-01 ENCOUNTER — Ambulatory Visit (INDEPENDENT_AMBULATORY_CARE_PROVIDER_SITE_OTHER): Payer: Medicaid Other | Admitting: Primary Care

## 2021-05-01 ENCOUNTER — Encounter (INDEPENDENT_AMBULATORY_CARE_PROVIDER_SITE_OTHER): Payer: Self-pay | Admitting: Primary Care

## 2021-05-01 ENCOUNTER — Other Ambulatory Visit: Payer: Self-pay

## 2021-05-01 DIAGNOSIS — Z6833 Body mass index (BMI) 33.0-33.9, adult: Secondary | ICD-10-CM | POA: Diagnosis not present

## 2021-05-01 MED ORDER — PHENTERMINE HCL 37.5 MG PO CAPS: 37.5000 mg | ORAL_CAPSULE | ORAL | 1 refills | Status: DC

## 2021-05-01 NOTE — Progress Notes (Signed)
? ? ?         [facility] ? ? ?[name], is a 31 y.o. female presents for a follow up after starting on Phentermine  for weight loss for 2 months. Patient states they have improved meal pattern, less frequent dining out, decreased fat intake, fewer sweetened foods & beverages, increased physical activity, and decreased sodium intake. While on the phentermine they have lost 9 lbs since last visit. They deny palpitations, anxiety, trouble sleeping, elevated BP.  ? ?BP Readings from Last 3 Encounters:  ?05/01/21 134/87  ?04/03/21 119/86  ?01/01/21 122/84  ? ? ?Wt Readings from Last 3 Encounters:  ?05/01/21 190 lb 3.2 oz (86.3 kg)  ?04/03/21 199 lb 6.4 oz (90.4 kg)  ?01/01/21 198 lb 6.4 oz (90 kg)  ? ? ?Typical breakfast: ?Typical lunch:  ?Typical dinner: ? ?Medications: ?Current Outpatient Medications on File Prior to Visit  ?Medication Sig Dispense Refill  ? phentermine 37.5 MG capsule Take 1 capsule (37.5 mg total) by mouth every morning. 30 capsule 1  ? Docosanol (ABREVA) 10 % CREA Apply 2 g topically as needed. (Patient not taking: Reported on 04/03/2021) 2 g 1  ? ferrous sulfate 325 (65 FE) MG tablet Take 1 tablet (325 mg total) by mouth daily with breakfast. (Patient not taking: Reported on 04/03/2021) 90 tablet 3  ? ?No current facility-administered medications on file prior to visit.  ? ? ?ROS:  ? ?Denies any headaches, blurred vision, fatigue, shortness of breath, chest pain, abdominal pain, abnormal vaginal discharge/itching/odor/irritation, problems with periods, bowel movements, urination, or intercourse unless otherwise stated above. ? ?Physical exam: ? ?Vitals:  ? 05/01/21 1011  ?BP: 134/87  ?Pulse: 100  ?Temp: 98.6 ?F (37 ?C)  ?SpO2: 99%  ? ? ?BP 134/87 (BP Location: Right Arm, Patient Position: Sitting, Cuff Size: Large)   Pulse 100   Temp 98.6 ?F (37 ?C) (Oral)   Ht 5\' 3"  (1.6 m)   Wt 190 lb 3.2 oz (86.3 kg)   LMP 04/29/2021 (Exact Date)   SpO2 99%   BMI 33.69 kg/m?   ?General appearance: alert, cooperative, appears stated age, and mildly obese ?Head: Normocephalic, without obvious abnormality, atraumatic ?Eyes: conjunctivae/corneas clear. PERRL, EOM's intact. Fundi benign. ?Neck: no adenopathy, no carotid bruit, no JVD, supple, symmetrical, trachea midline, and thyroid not enlarged, symmetric, no tenderness/mass/nodules ?Back: symmetric, no curvature. ROM normal. No CVA tenderness. ?Lungs: clear to auscultation bilaterally ?Heart: regular rate and rhythm, S1, S2 normal, no murmur, click, rub or gallop ?Abdomen: soft, non-tender; bowel sounds normal; no masses,  no organomegaly ?Extremities: extremities normal, atraumatic, no cyanosis or edema ?Skin: Skin color, texture, turgor normal. No rashes or lesions ?Lymph nodes: Cervical, supraclavicular, and axillary nodes normal. ? ? ?Assessment and Plan: ?Angela Robertson was seen today for weight check. ? ?Diagnoses and all orders for this visit: ? ?Class 2 severe obesity due to excess calories with serious comorbidity in adult, unspecified BMI (HCC) ?-    ?Obesity with co morbid conditions.  ?General weight loss/lifestyle modification strategies discussed (elicit support from others; identify saboteurs; non-food rewards, etc). ?Informal exercise measures discussed, e.g. taking stairs instead of elevator. ?Medication: phentermine 37.5 MG capsule; Take 1 capsule (37.5 mg total) by mouth every morning. ?Follow up in: 1 month ? ? ?This note has been created with 05/01/2021. Any transcriptional errors are unintentional.  ? ?Education officer, environmental, NP ?05/01/2021, 10:21 AM  ?

## 2021-05-01 NOTE — Patient Instructions (Signed)
Phentermine Capsules or Tablets °What is this medication? °PHENTERMINE (FEN ter meen) promotes weight loss. It works by decreasing appetite. It is often used for a short period of time. Changes to diet and exercise are often combined with this medication. °This medicine may be used for other purposes; ask your health care provider or pharmacist if you have questions. °COMMON BRAND NAME(S): Adipex-P, Atti-Plex P, Atti-Plex P Spansule, Fastin, Lomaira, Pro-Fast, Pro-Fast HS, Pro-Fast SA, Tara-8 °What should I tell my care team before I take this medication? °They need to know if you have any of these conditions: °Agitation or nervousness °Diabetes °Glaucoma °Heart disease °High blood pressure °History of drug abuse or addiction °History of stroke °Kidney disease °Lung disease called Primary Pulmonary Hypertension (PPH) °Taken an MAOI like Carbex, Eldepryl, Marplan, Nardil, or Parnate in last 14 days °Taking stimulant medications for attention disorders, weight loss, or to stay awake °Thyroid disease °An unusual or allergic reaction to phentermine, other medications, foods, dyes, or preservatives °Pregnant or trying to get pregnant °Breast-feeding °How should I use this medication? °Take this medication by mouth with a glass of water. Follow the directions on the prescription label. Take your medication at regular intervals. Do not take it more often than directed. Do not stop taking except on your care team's advice. °Talk to your care team about the use of this medication in children. While this medication may be prescribed for children 17 years or older for selected conditions, precautions do apply. °Overdosage: If you think you have taken too much of this medicine contact a poison control center or emergency room at once. °NOTE: This medicine is only for you. Do not share this medicine with others. °What if I miss a dose? °If you miss a dose, take it as soon as you can. If it is almost time for your next dose, take  only that dose. Do not take double or extra doses. °What may interact with this medication? °Do not take this medication with any of the following: °MAOIs like Carbex, Eldepryl, Marplan, Nardil, and Parnate °This medication may also interact with the following: °Alcohol °Certain medications for depression, anxiety, or psychotic disorders °Certain medications for high blood pressure °Linezolid °Medications for colds or breathing difficulties like pseudoephedrine or phenylephrine °Medications for diabetes °Sibutramine °Stimulant medications for attention disorders, weight loss, or to stay awake °This list may not describe all possible interactions. Give your health care provider a list of all the medicines, herbs, non-prescription drugs, or dietary supplements you use. Also tell them if you smoke, drink alcohol, or use illegal drugs. Some items may interact with your medicine. °What should I watch for while using this medication? °Visit your care team for regular checks on your progress. °Do not stop taking except on your care team's advice. You may develop a severe reaction. Your care team will tell you how much medication to take. °Do not take this medication close to bedtime. It may prevent you from sleeping. °You may get drowsy or dizzy. Do not drive, use machinery, or do anything that needs mental alertness until you know how this medication affects you. Do not stand or sit up quickly, especially if you are an older patient. This reduces the risk of dizzy or fainting spells. Alcohol may increase dizziness and drowsiness. Avoid alcoholic drinks. °This medication may affect blood sugar levels. Ask your care team if changes in diet or medications are needed if you have diabetes. °Women should inform their care team if they wish to become   pregnant or think they might be pregnant. Losing weight while pregnant is not advised and may cause harm to the unborn child. Talk to your care team for more information. °What side  effects may I notice from receiving this medication? °Side effects that you should report to your care team as soon as possible: °Allergic reactions--skin rash, itching, hives, swelling of the face, lips, tongue, or throat °Heart failure--shortness of breath, swelling of the ankles, feet, or hands, sudden weight gain, unusual weakness or fatigue °Pulmonary hypertension--shortness of breath, chest pain, fast or irregular heartbeat, feeling faint or lightheaded, fatigue, swelling of the ankles or feet °Side effects that usually do not require medical attention (report to your care team if they continue or are bothersome): °Change in taste °Diarrhea °Dizziness °Dry mouth °Restlessness °Trouble sleeping °This list may not describe all possible side effects. Call your doctor for medical advice about side effects. You may report side effects to FDA at 1-800-FDA-1088. °Where should I keep my medication? °Keep out of the reach of children. This medication can be abused. Keep your medication in a safe place to protect it from theft. Do not share this medication with anyone. Selling or giving away this medication is dangerous and against the law. °This medication may cause harm and death if it is taken by other adults, children, or pets. Return medication that has not been used to an official disposal site. Contact the DEA at 1-800-882-9539 or your city/county government to find a site. If you cannot return the medication, mix any unused medication with a substance like cat litter or coffee grounds. Then throw the medication away in a sealed container like a sealed bag or coffee can with a lid. Do not use the medication after the expiration date. °Store at room temperature between 20 and 25 degrees C (68 and 77 degrees F). Keep container tightly closed. °NOTE: This sheet is a summary. It may not cover all possible information. If you have questions about this medicine, talk to your doctor, pharmacist, or health care  provider. °© 2022 Elsevier/Gold Standard (2020-11-06 00:00:00) ° °

## 2021-06-06 ENCOUNTER — Ambulatory Visit (INDEPENDENT_AMBULATORY_CARE_PROVIDER_SITE_OTHER): Payer: Medicaid Other | Admitting: Primary Care

## 2021-07-04 ENCOUNTER — Ambulatory Visit (INDEPENDENT_AMBULATORY_CARE_PROVIDER_SITE_OTHER): Payer: Medicaid Other | Admitting: Primary Care

## 2021-08-27 IMAGING — DX DG CHEST 1V PORT
1 series · 1 of 1 positions shown · non-contrast
Comparison: None.

CLINICAL DATA: Chest pain

EXAM:
PORTABLE CHEST 1 VIEW

[chest]
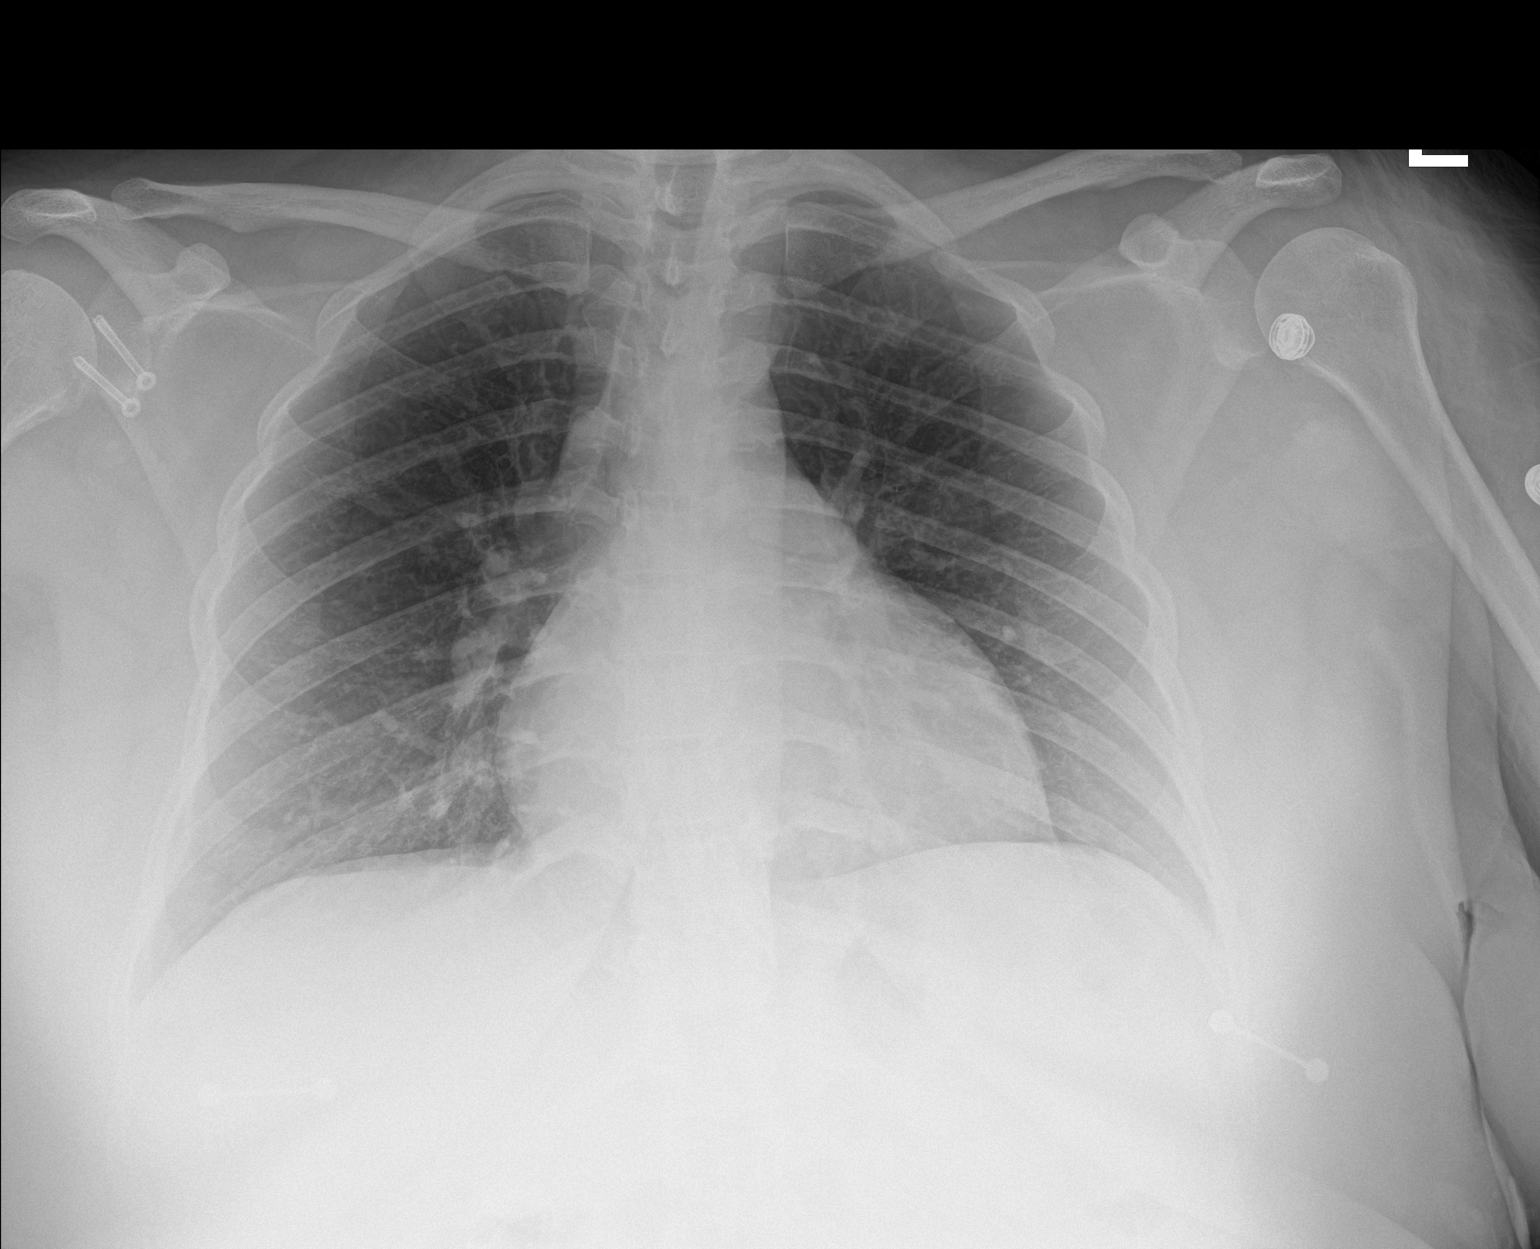

[1 of 1 positions shown; findings below may reference images not displayed]

FINDINGS: The heart size and mediastinal contours are within normal limits
given technique. Both lungs are clear. Fixation screws overlie the
right glenoid.
IMPRESSION: No active disease.

## 2021-11-05 ENCOUNTER — Other Ambulatory Visit (INDEPENDENT_AMBULATORY_CARE_PROVIDER_SITE_OTHER): Payer: Self-pay | Admitting: Primary Care

## 2021-11-14 ENCOUNTER — Ambulatory Visit (INDEPENDENT_AMBULATORY_CARE_PROVIDER_SITE_OTHER): Payer: Medicaid Other | Admitting: Primary Care

## 2021-12-02 ENCOUNTER — Encounter (INDEPENDENT_AMBULATORY_CARE_PROVIDER_SITE_OTHER): Payer: Self-pay | Admitting: Primary Care

## 2021-12-02 ENCOUNTER — Ambulatory Visit (INDEPENDENT_AMBULATORY_CARE_PROVIDER_SITE_OTHER): Payer: Medicaid Other | Admitting: Primary Care

## 2021-12-02 DIAGNOSIS — Z6835 Body mass index (BMI) 35.0-35.9, adult: Secondary | ICD-10-CM | POA: Diagnosis not present

## 2021-12-02 MED ORDER — PHENTERMINE HCL 37.5 MG PO CAPS: 37.5000 mg | ORAL_CAPSULE | ORAL | 1 refills | Status: AC

## 2021-12-02 NOTE — Patient Instructions (Signed)
Phentermine Capsules or Tablets What is this medication? PHENTERMINE (FEN ter meen) promotes weight loss. It works by decreasing appetite. It is often used for a short period of time. Changes to diet and exercise are often combined with this medication. This medicine may be used for other purposes; ask your health care provider or pharmacist if you have questions. COMMON BRAND NAME(S): Adipex-P, Atti-Plex P, Atti-Plex P Spansule, Fastin, Lomaira, Pro-Fast, Pro-Fast HS, Pro-Fast SA, Tara-8 What should I tell my care team before I take this medication? They need to know if you have any of these conditions: Agitation or nervousness Diabetes Glaucoma Heart disease High blood pressure History of drug abuse or addiction History of stroke Kidney disease Lung disease called Primary Pulmonary Hypertension (PPH) Taken an MAOI like Carbex, Eldepryl, Marplan, Nardil, or Parnate in last 14 days Taking stimulant medications for attention disorders, weight loss, or to stay awake Thyroid disease An unusual or allergic reaction to phentermine, other medications, foods, dyes, or preservatives Pregnant or trying to get pregnant Breast-feeding How should I use this medication? Take this medication by mouth with a glass of water. Follow the directions on the prescription label. Take your medication at regular intervals. Do not take it more often than directed. Do not stop taking except on your care team's advice. Talk to your care team about the use of this medication in children. While this medication may be prescribed for children 17 years or older for selected conditions, precautions do apply. Overdosage: If you think you have taken too much of this medicine contact a poison control center or emergency room at once. NOTE: This medicine is only for you. Do not share this medicine with others. What if I miss a dose? If you miss a dose, take it as soon as you can. If it is almost time for your next dose, take  only that dose. Do not take double or extra doses. What may interact with this medication? Do not take this medication with any of the following: MAOIs like Carbex, Eldepryl, Marplan, Nardil, and Parnate This medication may also interact with the following: Alcohol Certain medications for depression, anxiety, or psychotic disorders Certain medications for high blood pressure Linezolid Medications for colds or breathing difficulties like pseudoephedrine or phenylephrine Medications for diabetes Sibutramine Stimulant medications for attention disorders, weight loss, or to stay awake This list may not describe all possible interactions. Give your health care provider a list of all the medicines, herbs, non-prescription drugs, or dietary supplements you use. Also tell them if you smoke, drink alcohol, or use illegal drugs. Some items may interact with your medicine. What should I watch for while using this medication? Visit your care team for regular checks on your progress. Do not stop taking except on your care team's advice. You may develop a severe reaction. Your care team will tell you how much medication to take. Do not take this medication close to bedtime. It may prevent you from sleeping. You may get drowsy or dizzy. Do not drive, use machinery, or do anything that needs mental alertness until you know how this medication affects you. Do not stand or sit up quickly, especially if you are an older patient. This reduces the risk of dizzy or fainting spells. Alcohol may increase dizziness and drowsiness. Avoid alcoholic drinks. This medication may affect blood sugar levels. Ask your care team if changes in diet or medications are needed if you have diabetes. Women should inform their care team if they wish to become   pregnant or think they might be pregnant. Losing weight while pregnant is not advised and may cause harm to the unborn child. Talk to your care team for more information. What side  effects may I notice from receiving this medication? Side effects that you should report to your care team as soon as possible: Allergic reactions--skin rash, itching, hives, swelling of the face, lips, tongue, or throat Heart valve disease--shortness of breath, chest pain, unusual weakness or fatigue, dizziness, feeling faint or lightheaded, fever, sudden weight gain, fast or irregular heartbeat Pulmonary hypertension--shortness of breath, chest pain, fast or irregular heartbeat, feeling faint or lightheaded, fatigue, swelling of the ankles or feet Side effects that usually do not require medical attention (report to your care team if they continue or are bothersome): Change in taste Diarrhea Dizziness Dry mouth Restlessness Trouble sleeping This list may not describe all possible side effects. Call your doctor for medical advice about side effects. You may report side effects to FDA at 1-800-FDA-1088. Where should I keep my medication? Keep out of the reach of children. This medication can be abused. Keep your medication in a safe place to protect it from theft. Do not share this medication with anyone. Selling or giving away this medication is dangerous and against the law. This medication may cause harm and death if it is taken by other adults, children, or pets. Return medication that has not been used to an official disposal site. Contact the DEA at 1-800-882-9539 or your city/county government to find a site. If you cannot return the medication, mix any unused medication with a substance like cat litter or coffee grounds. Then throw the medication away in a sealed container like a sealed bag or coffee can with a lid. Do not use the medication after the expiration date. Store at room temperature between 20 and 25 degrees C (68 and 77 degrees F). Keep container tightly closed. NOTE: This sheet is a summary. It may not cover all possible information. If you have questions about this medicine,  talk to your doctor, pharmacist, or health care provider.  2023 Elsevier/Gold Standard (2021-01-18 00:00:00)  

## 2021-12-02 NOTE — Progress Notes (Signed)
           Avon, is a 31 y.o. female presents for requesting starting on phentermine,again for weight loss.She has been off of the medication sine March 2023. She did not have any problems taking medication prior.  BP Readings from Last 3 Encounters:  12/02/21 120/81  05/01/21 134/87  04/03/21 119/86    Wt Readings from Last 3 Encounters:  12/02/21 202 lb 3.2 oz (91.7 kg)  05/01/21 190 lb 3.2 oz (86.3 kg)  04/03/21 199 lb 6.4 oz (90.4 kg)   Medications: Current Outpatient Medications on File Prior to Visit  Medication Sig Dispense Refill   phentermine 37.5 MG capsule Take 1 capsule (37.5 mg total) by mouth every morning. 30 capsule 1   Docosanol (ABREVA) 10 % CREA Apply 2 g topically as needed. (Patient not taking: Reported on 04/03/2021) 2 g 1   ferrous sulfate 325 (65 FE) MG tablet Take 1 tablet (325 mg total) by mouth daily with breakfast. (Patient not taking: Reported on 04/03/2021) 90 tablet 3   No current facility-administered medications on file prior to visit.    ROS:   Denies any headaches, blurred vision, fatigue, shortness of breath, chest pain, abdominal pain, abnormal vaginal discharge/itching/odor/irritation, problems with periods, bowel movements, urination, or intercourse unless otherwise stated above.  Physical exam:  Vitals:   12/02/21 1029  BP: 120/81  Pulse: 80  Resp: 16  SpO2: 97%    Assessment and Plan: Florabelle was seen today for medication refill.  Diagnoses and all orders for this visit:  Class 2 severe obesity due to excess calories with serious comorbidity in adult, unspecified BMI ([place])  General weight loss/lifestyle modification strategies discussed (elicit support from others; identify saboteurs; non-food rewards, etc). Medication  -phentermine 37.5 MG capsule; Take 1 capsule (37.5 mg total) by mouth every morning.   This note has been created with Automotive engineer. Any transcriptional errors are unintentional.   Kerin Perna, NP 12/02/2021, 10:59 AM

## 2021-12-12 ENCOUNTER — Institutional Professional Consult (permissible substitution): Payer: Medicaid Other | Admitting: Plastic Surgery

## 2022-01-02 ENCOUNTER — Ambulatory Visit (INDEPENDENT_AMBULATORY_CARE_PROVIDER_SITE_OTHER): Payer: Medicaid Other | Admitting: Primary Care

## 2022-01-02 ENCOUNTER — Encounter (INDEPENDENT_AMBULATORY_CARE_PROVIDER_SITE_OTHER): Payer: Self-pay | Admitting: Primary Care

## 2022-01-02 VITALS — BP 129/90 | HR 102 | Resp 16 | Wt 196.4 lb

## 2022-01-02 DIAGNOSIS — Z1159 Encounter for screening for other viral diseases: Secondary | ICD-10-CM

## 2022-01-02 DIAGNOSIS — Z9189 Other specified personal risk factors, not elsewhere classified: Secondary | ICD-10-CM

## 2022-01-02 DIAGNOSIS — Z114 Encounter for screening for human immunodeficiency virus [HIV]: Secondary | ICD-10-CM | POA: Diagnosis not present

## 2022-01-02 DIAGNOSIS — J0110 Acute frontal sinusitis, unspecified: Secondary | ICD-10-CM | POA: Diagnosis not present

## 2022-01-02 MED ORDER — GUAIFENESIN 200 MG/10ML PO LIQD: 10.0000 mL | Freq: Three times a day (TID) | ORAL | 1 refills | Status: DC

## 2022-01-02 MED ORDER — LEVOCETIRIZINE DIHYDROCHLORIDE 5 MG PO TABS: 5.0000 mg | ORAL_TABLET | Freq: Every evening | ORAL | 1 refills | Status: DC

## 2022-01-02 MED ORDER — FLUTICASONE PROPIONATE 50 MCG/ACT NA SUSP: 2.0000 | Freq: Every day | NASAL | 6 refills | Status: DC

## 2022-01-02 NOTE — Progress Notes (Signed)
Angela Robertson, is a 31 y.o. female presents for a follow up after starting on phentermine for weight loss for 1 months. Patient states they have improved meal pattern, less frequent dining out, decreased fat intake, increased physical activity, and better food choices. While on the phentermine they have lost 6 lbs since last visit. They deny palpitations, anxiety, trouble sleeping, elevated BP.  Patient is concerned with a cough with clear phlegm and congestion.  BP Readings from Last 3 Encounters:  01/02/22 (!) 129/90  12/02/21 120/81  05/01/21 134/87    Wt Readings from Last 3 Encounters:  01/02/22 196 lb 6.4 oz (89.1 kg)  12/02/21 202 lb 3.2 oz (91.7 kg)  05/01/21 190 lb 3.2 oz (86.3 kg)    Typical breakfast: Typical lunch:  Typical dinner:  Medications: Current Outpatient Medications on File Prior to Visit  Medication Sig Dispense Refill   Docosanol (ABREVA) 10 % CREA Apply 2 g topically as needed. (Patient not taking: Reported on 04/03/2021) 2 g 1   ferrous sulfate 325 (65 FE) MG tablet Take 1 tablet (325 mg total) by mouth daily with breakfast. (Patient not taking: Reported on 04/03/2021) 90 tablet 3   phentermine 37.5 MG capsule Take 1 capsule (37.5 mg total) by mouth every morning. 30 capsule 1   No current facility-administered medications on file prior to visit.    ROS:   Denies any headaches, blurred vision, fatigue, shortness of breath, chest pain, abdominal pain, abnormal vaginal discharge/itching/odor/irritation, problems with periods, bowel movements, urination, or intercourse unless otherwise stated above.  Physical exam:  Vitals:   01/02/22 0911  BP: (!) 129/90  Pulse: (!) 102  Resp: 16  SpO2: 99%    General appearance: alert, appears stated age, and mildly obese Head: Normocephalic, without obvious abnormality, atraumatic Eyes: conjunctivae/corneas clear. PERRL, EOM's intact. Fundi benign. Ears: normal TM's and  external ear canals both ears Nose: moderate congestion, right turbinate red, left turbinate red, no sinus tenderness, sinus tenderness bilateral Throat: abnormal findings: red  Neck: no adenopathy, no carotid bruit, no JVD, supple, symmetrical, trachea midline, and thyroid not enlarged, symmetric, no tenderness/mass/nodules Lungs: clear to auscultation bilaterally Abdomen: soft, non-tender; bowel sounds normal; no masses,  no organomegaly Skin: Skin color, texture, turgor normal. No rashes or lesions  Assessment and Plan:  Obesity with co m Angela Robertson was seen today for weight management screening and cough.  Diagnoses and all orders for this visit:  Class 2 severe obesity due to excess calories with serious comorbidity in adult, unspecified BMI ([place]) morbid conditions.  General weight loss/lifestyle modification strategies discussed (elicit support from others; identify saboteurs; non-food rewards, etc). Informal exercise measures discussed, e.g. taking stairs instead of elevator. Medication: phentermine. Follow up in: 1 month} and as needed.   Acute frontal sinusitis, recurrence not specified -     guaiFENesin 200 MG/10ML LIQD; Take 10 mLs by mouth in the morning, at noon, and at bedtime. -     levocetirizine (XYZAL) 5 MG tablet; Take 1 tablet (5 mg total) by mouth every evening. -     fluticasone (FLONASE) 50 MCG/ACT nasal spray; Place 2 sprays into both nostrils daily.  Encounter for HCV screening test for high risk patient -     HCV Ab w Reflex to Quant PCR  Screening for HIV (human immunodeficiency virus) -     HIV Antibody (routine testing w rflx)  Other orders -  Interpretation:    This note has been created with Education officer, environmental. Any transcriptional errors are unintentional.   Grayce Sessions, NP 01/05/2022, 9:08 PM

## 2022-01-03 LAB — HCV INTERPRETATION

## 2022-01-03 LAB — HCV AB W REFLEX TO QUANT PCR: HCV Ab: NONREACTIVE

## 2022-01-03 LAB — HIV ANTIBODY (ROUTINE TESTING W REFLEX): HIV Screen 4th Generation wRfx: NONREACTIVE

## 2022-02-07 ENCOUNTER — Encounter (HOSPITAL_COMMUNITY): Payer: Self-pay

## 2022-02-07 ENCOUNTER — Emergency Department (HOSPITAL_COMMUNITY): Payer: Self-pay

## 2022-02-07 ENCOUNTER — Ambulatory Visit (HOSPITAL_COMMUNITY)
Admission: EM | Admit: 2022-02-07 | Discharge: 2022-02-07 | Disposition: A | Discharge status: Home / Self Care | Payer: Medicaid Other | Attending: Family Medicine | Admitting: Family Medicine

## 2022-02-07 ENCOUNTER — Emergency Department (HOSPITAL_COMMUNITY)
Admission: EM | Admit: 2022-02-07 | Discharge: 2022-02-07 | Disposition: A | Discharge status: Home / Self Care | Payer: Self-pay | Attending: Emergency Medicine | Admitting: Emergency Medicine

## 2022-02-07 DIAGNOSIS — N3001 Acute cystitis with hematuria: Secondary | ICD-10-CM | POA: Insufficient documentation

## 2022-02-07 DIAGNOSIS — R110 Nausea: Secondary | ICD-10-CM

## 2022-02-07 DIAGNOSIS — N9489 Other specified conditions associated with female genital organs and menstrual cycle: Secondary | ICD-10-CM | POA: Insufficient documentation

## 2022-02-07 DIAGNOSIS — R1031 Right lower quadrant pain: Secondary | ICD-10-CM

## 2022-02-07 DIAGNOSIS — Z91040 Latex allergy status: Secondary | ICD-10-CM | POA: Insufficient documentation

## 2022-02-07 LAB — COMPREHENSIVE METABOLIC PANEL
ALT: 12 U/L (ref 0–44)
AST: 19 U/L (ref 15–41)
Albumin: 3.4 g/dL — ABNORMAL LOW (ref 3.5–5.0)
Alkaline Phosphatase: 41 U/L (ref 38–126)
Anion gap: 6 (ref 5–15)
BUN: 8 mg/dL (ref 6–20)
CO2: 24 mmol/L (ref 22–32)
Calcium: 9.1 mg/dL (ref 8.9–10.3)
Chloride: 111 mmol/L (ref 98–111)
Creatinine, Ser: 0.73 mg/dL (ref 0.44–1.00)
GFR, Estimated: >60 mL/min (ref >60)
Glucose, Bld: 101 mg/dL — ABNORMAL HIGH (ref 70–99)
Potassium: 3.7 mmol/L (ref 3.5–5.1)
Sodium: 141 mmol/L (ref 135–145)
Total Bilirubin: 0.4 mg/dL (ref 0.3–1.2)
Total Protein: 7.3 g/dL (ref 6.5–8.1)

## 2022-02-07 LAB — URINALYSIS, ROUTINE W REFLEX MICROSCOPIC
Bilirubin Urine: NEGATIVE
Glucose, UA: NEGATIVE mg/dL
Hgb urine dipstick: NEGATIVE
Ketones, ur: NEGATIVE mg/dL
Nitrite: NEGATIVE
Protein, ur: NEGATIVE mg/dL
Specific Gravity, Urine: 1.026 (ref 1.005–1.030)
pH: 6 (ref 5.0–8.0)

## 2022-02-07 LAB — CBC
HCT: 31.5 % — ABNORMAL LOW (ref 36.0–46.0)
Hemoglobin: 9.3 g/dL — ABNORMAL LOW (ref 12.0–15.0)
MCH: 20.3 pg — ABNORMAL LOW (ref 26.0–34.0)
MCHC: 29.5 g/dL — ABNORMAL LOW (ref 30.0–36.0)
MCV: 68.8 fL — ABNORMAL LOW (ref 80.0–100.0)
Platelets: 301 10*3/uL (ref 150–400)
RBC: 4.58 MIL/uL (ref 3.87–5.11)
RDW: 19.2 % — ABNORMAL HIGH (ref 11.5–15.5)
WBC: 8.3 10*3/uL (ref 4.0–10.5)
nRBC: 0 % (ref 0.0–0.2)

## 2022-02-07 LAB — I-STAT BETA HCG BLOOD, ED (MC, WL, AP ONLY): I-stat hCG, quantitative: <5 m[IU]/mL (ref <5)

## 2022-02-07 LAB — LACTIC ACID, PLASMA: Lactic Acid, Venous: 1 mmol/L (ref 0.5–1.9)

## 2022-02-07 LAB — LIPASE, BLOOD: Lipase: 29 U/L (ref 11–51)

## 2022-02-07 MED ORDER — HYDROCODONE-ACETAMINOPHEN 5-325 MG PO TABS
1.0000 | ORAL_TABLET | Freq: Once | ORAL | Status: AC
  Administered 2022-02-07: 1 via ORAL
  Filled 2022-02-07: qty 1

## 2022-02-07 MED ORDER — ONDANSETRON 4 MG PO TBDP
4.0000 mg | ORAL_TABLET | Freq: Once | ORAL | Status: AC
  Administered 2022-02-07: 4 mg via ORAL
  Filled 2022-02-07: qty 1

## 2022-02-07 MED ORDER — IOHEXOL 350 MG/ML SOLN
75.0000 mL | Freq: Once | INTRAVENOUS | Status: AC | PRN
  Administered 2022-02-07: 75 mL via INTRAVENOUS

## 2022-02-07 MED ORDER — KETOROLAC TROMETHAMINE 15 MG/ML IJ SOLN
15.0000 mg | Freq: Once | INTRAMUSCULAR | Status: AC
  Administered 2022-02-07: 15 mg via INTRAVENOUS
  Filled 2022-02-07: qty 1

## 2022-02-07 MED ORDER — SODIUM CHLORIDE 0.9 % IV SOLN
1.0000 g | Freq: Once | INTRAVENOUS | Status: AC
  Administered 2022-02-07: 1 g via INTRAVENOUS
  Filled 2022-02-07: qty 10

## 2022-02-07 MED ORDER — CEPHALEXIN 500 MG PO CAPS: 500.0000 mg | ORAL_CAPSULE | Freq: Four times a day (QID) | ORAL | 0 refills | Status: AC

## 2022-02-07 MED ORDER — MORPHINE SULFATE (PF) 4 MG/ML IV SOLN
4.0000 mg | Freq: Once | INTRAVENOUS | Status: AC
  Administered 2022-02-07: 4 mg via INTRAVENOUS
  Filled 2022-02-07: qty 1

## 2022-02-07 NOTE — ED Notes (Signed)
Reviewed discharge instructions with patient. Follow-up care and medications reviewed. Patient  verbalized understanding. Patient A&Ox4, VSS, and ambulatory with steady gait upon discharge.  °

## 2022-02-07 NOTE — ED Provider Triage Note (Signed)
Emergency Medicine Provider Triage Evaluation Note  Angela Robertson , a 31 y.o. female  was evaluated in triage.  Pt complains of RLQ abdominal pain radiating to the belly button for last day. Associated nausea, no vomiting. Endorses marijuana use. No fever or chills. Pain is sharp.  Review of Systems  Positive: Nausea, Abdominal pain Negative: vomiting  Physical Exam  BP 126/68 (BP Location: Right Arm)   Pulse 83   Temp 98.2 F (36.8 C) (Oral)   Resp 16   LMP 01/31/2022   SpO2 100%  Gen:   Awake, no distress   Resp:  Normal effort  MSK:   Moves extremities without difficulty  Other:  Diffuse abd pain w/o guarding  Medical Decision Making  Medically screening exam initiated at 9:31 AM.  Appropriate orders placed.  Angela Robertson was informed that the remainder of the evaluation will be completed by another provider, this initial triage assessment does not replace that evaluation, and the importance of remaining in the ED until their evaluation is complete.    Angela Robertson, [place] 02/07/22 [phone]

## 2022-02-07 NOTE — Discharge Instructions (Signed)
You were seen today for severe abdominal pain.  I am concerned about an ACUTE APPENDICITIS and would like you to go to the ER for further testing and evaluation.

## 2022-02-07 NOTE — ED Provider Notes (Signed)
[facility]    CSN: [id number] Arrival date & time: 02/07/22  0807      History   Chief Complaint Chief Complaint  Patient presents with   Abdominal Pain   Hip Pain    HPI Angela Robertson is a 31 y.o. female.   Patient is here for RLQ pain.  Started yesterday, and pain is worsening.  8/10 in pain today.  Painful to even walk.  She is having chills, no fever.  + nausea, some diarrhea.  Has not been able to eat/drink anything since the pain started.        Past Medical History:  Diagnosis Date   Iron deficiency anemia     There are no problems to display for this patient.   Past Surgical History:  Procedure Laterality Date   BREAST SURGERY     Reduction   CESAREAN SECTION      OB History   No obstetric history on file.      Home Medications    Prior to Admission medications   Medication Sig Start Date End Date Taking? Authorizing Provider  Docosanol (ABREVA) 10 % CREA Apply 2 g topically as needed. Patient not taking: Reported on 04/03/2021 01/01/21   Grayce Sessions, NP  ferrous sulfate 325 (65 FE) MG tablet Take 1 tablet (325 mg total) by mouth daily with breakfast. Patient not taking: Reported on 04/03/2021 01/04/21   Grayce Sessions, NP  fluticasone (FLONASE) 50 MCG/ACT nasal spray Place 2 sprays into both nostrils daily. 01/02/22   Grayce Sessions, NP  guaiFENesin 200 MG/10ML LIQD Take 10 mLs by mouth in the morning, at noon, and at bedtime. 01/02/22   Grayce Sessions, NP  levocetirizine (XYZAL) 5 MG tablet Take 1 tablet (5 mg total) by mouth every evening. 01/02/22   Grayce Sessions, NP  phentermine 37.5 MG capsule Take 1 capsule (37.5 mg total) by mouth every morning. 12/02/21   Grayce Sessions, NP    Family History History reviewed. No pertinent family history.  Social History Social History   Tobacco Use   Smoking status: Former   Smokeless tobacco: Never  Substance Use Topics   Alcohol use: Not Currently    Drug use: Not Currently     Allergies   Peach [prunus persica] and Latex   Review of Systems Review of Systems  Constitutional:  Positive for chills. Negative for fever.  HENT: Negative.    Respiratory: Negative.    Gastrointestinal:  Positive for abdominal pain, diarrhea and nausea.  Musculoskeletal: Negative.   Psychiatric/Behavioral: Negative.       Physical Exam Triage Vital Signs ED Triage Vitals  Enc Vitals Group     BP 02/07/22 0838 104/75     Pulse Rate 02/07/22 0838 84     Resp 02/07/22 0838 16     Temp 02/07/22 0838 98.1 F (36.7 C)     Temp Source 02/07/22 0838 Oral     SpO2 02/07/22 0838 98 %     Weight --      Height --      Head Circumference --      Peak Flow --      Pain Score 02/07/22 0836 6     Pain Loc --      Pain Edu? --      Excl. in GC? --    No data found.  Updated Vital Signs BP 104/75 (BP Location: Left Arm)   Pulse 84   Temp  98.1 F (36.7 C) (Oral)   Resp 16   LMP 01/31/2022   SpO2 98%   Visual Acuity Right Eye Distance:   Left Eye Distance:   Bilateral Distance:    Right Eye Near:   Left Eye Near:    Bilateral Near:     Physical Exam Constitutional:      Appearance: She is well-developed. She is ill-appearing.     Comments: Appears uncomfortable during the conversation;  in pain getting up to get on the exam table  Cardiovascular:     Rate and Rhythm: Normal rate and regular rhythm.  Pulmonary:     Effort: Pulmonary effort is normal.     Breath sounds: Normal breath sounds.  Abdominal:     General: Bowel sounds are decreased.     Comments: She has TTP throughout the abdomen that radiates to the RLQ;  very tender to light palpation at the RLQ;  + guarding  Skin:    General: Skin is warm.  Neurological:     General: No focal deficit present.     Mental Status: She is alert.      UC Treatments / Results  Labs (all labs ordered are listed, but only abnormal results are displayed) Labs Reviewed - No data to  display  EKG   Radiology No results found.  Procedures Procedures (including critical care time)  Medications Ordered in UC Medications - No data to display  Initial Impression / Assessment and Plan / UC Course  I have reviewed the triage vital signs and the nursing notes.  Pertinent labs & imaging results that were available during my care of the patient were reviewed by me and considered in my medical decision making (see chart for details).   Final Clinical Impressions(s) / UC Diagnoses   Final diagnoses:  RLQ abdominal pain  Nausea without vomiting     Discharge Instructions      You were seen today for severe abdominal pain.  I am concerned about an ACUTE APPENDICITIS and would like you to go to the ER for further testing and evaluation.     ED Prescriptions   None    PDMP not reviewed this encounter.   Rondel Oh, MD 02/07/22 [phone]

## 2022-02-07 NOTE — ED Provider Notes (Signed)
Signed out at change of shift, some signs of urinary tract infection, culture added, CT and ultrasound are both unremarkable and reassuring, vital signs are reassuring, patient will be discharged with cephalexin prescription, agree with emergency department physician Jearld Fenton who signed the patient out to me.  Patient stable at discharge   Eber Hong, MD 02/07/22 1701

## 2022-02-07 NOTE — ED Provider Notes (Signed)
[facility] EMERGENCY DEPARTMENT Provider Note   CSN: [id number] Arrival date & time: 02/07/22  0915     History  Chief Complaint  Patient presents with   Abdominal Pain    Angela Robertson is a 31 y.o. female with obesity presents with abdominal pain.  Patient presents with acute onset severe right lower quadrant and periumbilical abdominal pain that feels sharp, rated 7 out of 10 that started acutely yesterday.  She has no history of this pain.  She states initially it was intermittent but now it is constant.  Associated with nausea and chills.  She has had no chest pain, shortness of breath, emesis, urinary symptoms, vaginal discharge burning or bleeding, diarrhea.  She does endorse history of constipation.  She is [id number], has had 5 C-sections, last in 2015, no other abdominal surgeries. Never has had a colonoscopy.  was seen at [facility] this AM and instructed to come to ED d/t concern for appendicitis. Her parents and her grandparents have had appendicitis.    Abdominal Pain      Home Medications Prior to Admission medications   Medication Sig Start Date End Date Taking? Authorizing Provider  acetaminophen (TYLENOL) 500 MG tablet Take 1,000 mg by mouth 2 (two) times daily as needed for moderate pain, mild pain, headache or fever.   Yes [provider]  phentermine 37.5 MG capsule Take 1 capsule (37.5 mg total) by mouth every morning. 12/02/21  Yes Grayce Sessions, NP  fluticasone (FLONASE) 50 MCG/ACT nasal spray Place 2 sprays into both nostrils daily. Patient not taking: Reported on 02/07/2022 01/02/22   Grayce Sessions, NP  guaiFENesin 200 MG/10ML LIQD Take 10 mLs by mouth in the morning, at noon, and at bedtime. Patient not taking: Reported on 02/07/2022 01/02/22   Grayce Sessions, NP  levocetirizine (XYZAL) 5 MG tablet Take 1 tablet (5 mg total) by mouth every evening. Patient not taking: Reported on 02/07/2022 01/02/22   Grayce Sessions, NP       Allergies    Peach [prunus persica] and Latex    Review of Systems   Review of Systems  Gastrointestinal:  Positive for abdominal pain.   Review of systems Positive for chills.  A 10 point review of systems was performed and is negative unless otherwise reported in HPI.  Physical Exam Updated Vital Signs BP (!) 121/104   Pulse 83   Temp (P) 97.8 F (36.6 C)   Resp 16   LMP 01/31/2022   SpO2 100%  Physical Exam General: Uncomfortable-appearing female, lying in bed.  HEENT: Sclera anicteric, MMM, trachea midline.  Cardiology: RRR, no murmurs/rubs/gallops. BL radial and DP pulses equal bilaterally.  Resp: Normal respiratory rate and effort. CTAB, no wheezes, rhonchi, crackles.  Abd: TTP with guarding in periumbilical region, RLQ, R adnexal region. Soft, non-distended.  GU: Deferred. MSK: No peripheral edema or signs of trauma. Extremities without deformity or TTP. No cyanosis or clubbing. Skin: warm, dry. No rashes or lesions. Back: No CVA tenderness Neuro: A&Ox4, CNs II-XII grossly intact. MAEs. Sensation grossly intact.  Psych: Normal mood and affect.   ED Results / Procedures / Treatments   Labs (all labs ordered are listed, but only abnormal results are displayed) Labs Reviewed  CBC - Abnormal; Notable for the following components:      Result Value   Hemoglobin 9.3 (*)    HCT 31.5 (*)    MCV 68.8 (*)    MCH 20.3 (*)    MCHC  29.5 (*)    RDW 19.2 (*)    All other components within normal limits  URINALYSIS, ROUTINE W REFLEX MICROSCOPIC - Abnormal; Notable for the following components:   APPearance HAZY (*)    Leukocytes,Ua SMALL (*)    Bacteria, UA RARE (*)    All other components within normal limits  LIPASE, BLOOD  COMPREHENSIVE METABOLIC PANEL  LACTIC ACID, PLASMA  LACTIC ACID, PLASMA  I-STAT BETA HCG BLOOD, ED ([facility], [facility], [facility] ONLY)    EKG None  Radiology No results found.  Procedures Procedures    Medications Ordered in ED Medications  morphine  (PF) 4 MG/ML injection 4 mg (has no administration in time range)  HYDROcodone-acetaminophen (NORCO/VICODIN) 5-325 MG per tablet 1 tablet (1 tablet Oral Given 02/07/22 0941)  ondansetron (ZOFRAN-ODT) disintegrating tablet 4 mg (4 mg Oral Given 02/07/22 0941)    ED Course/ Medical Decision Making/ A&P                          Medical Decision Making Amount and/or Complexity of Data Reviewed Labs: ordered. Decision-making details documented in ED Course. Radiology: ordered. Decision-making details documented in ED Course.  Risk Prescription drug management.     This patient presents to the ED for concern of abdominal pain, this involves an extensive number of treatment options, and is a complaint that carries with it a high risk of complications and morbidity.  I considered the following differential and admission for this acute, potentially life threatening condition.   MDM:    For DDX for abdominal pain includes but is not limited to:  Abdominal exam with peritoneal signs concerning for acute appendicitis or ovarian pathology such as torsion. Will obtain labs and CT scan for evaluation. Also consider SOB/ileus given concern for constipation though no bloating/distension on exam. Consider cholecystitis though no tenderness in RUQ, neg murphy's sign. No epigastric tenderness to indicate pancraetitis but will obtain lipase. Also consider viscous perforation, PUD, bowel ischemia. No hernia noted on exam.    Clinical Course as of 02/21/22 7564  [facility] Feb 07, 2022  1129 Urinalysis, Routine w reflex microscopic Urine, Clean Catch(!) Small leukocytes, nitrite negative, WBC 21-50, with rare bacteria and 11-20 RBCs. However, 6-10 squamous and patient is asymptomatic. Will draw urine culture.  [HN]  1130 Lipase: 29 [HN]  1130 WBC: 8.3 [HN]  1130 Hemoglobin(!): 9.3 Appears to be decreasing slowly from 10.3 in 2021. Low MCV and high RDW. [HN]  1312 I-stat hCG, quantitative: <5.0 [HN]  1314 CT  ABDOMEN PELVIS W CONTRAST IMPRESSION: 1. No acute CT findings of the abdomen or pelvis to explain abdominal pain. 2. Cholelithiasis without evidence of acute cholecystitis. 3. Fluid in the endometrial cavity and endocervical canal, functional in the reproductive age setting.   [HN]  1327 In absence of other findings on CT abdomen pelvis will treat patient for UTI. No nephrolithiasis or ureterolithiasis noted on imaging but possible that contrast could obscure a small stone. Also possible given pelvic free fluid on CT abdomen that she could have been experiencing ovarian torsion. Patient is improved now after the morphine and has had no recurrence of her pain, but will obtain doppler US to ensure good flow of ovary.  [HN]  1431 Lactic Acid, Venous: 1.0 [HN]    Clinical Course User Index [HN] Loetta Rough, MD     Labs: I Ordered, and personally interpreted labs.  The pertinent results include:  those listed above  Imaging Studies  ordered: I ordered imaging studies including CT abd/pelvis I independently visualized and interpreted imaging. I agree with the radiologist interpretation  Additional history obtained from chart review.  External records from outside source reviewed.  Cardiac Monitoring: The patient was maintained on a cardiac monitor.  I personally viewed and interpreted the cardiac monitored which showed an underlying rhythm of: NSR  Reevaluation: After the interventions noted above, I reevaluated the patient and found that they have :improved  Social Determinants of Health: Patient lives independently   Disposition:    TBD  Patient is signed out to the oncoming ED physician who is made aware of her history, presentation, exam, workup, and plan.  Plan is to obtain ovarian doppler to r/o torsion given sharp lower abd pain and pelvic free fluid. If negative, patient can be dc'd w/ UTI tx and f/u.    Co morbidities that complicate the patient evaluation  Past  Medical History:  Diagnosis Date   Iron deficiency anemia      Medicines Meds ordered this encounter  Medications   HYDROcodone-acetaminophen (NORCO/VICODIN) 5-325 MG per tablet 1 tablet   ondansetron (ZOFRAN-ODT) disintegrating tablet 4 mg   morphine (PF) 4 MG/ML injection 4 mg    I have reviewed the patients home medicines and have made adjustments as needed  Problem List / ED Course: Problem List Items Addressed This Visit   None Visit Diagnoses     Acute cystitis with hematuria    -  Primary                This note was created using dictation software, which may contain spelling or grammatical errors.    Loetta Rough, MD 02/21/22 [phone]

## 2022-02-07 NOTE — ED Notes (Signed)
Patient is being discharged from the Urgent Care and sent to the Emergency Department via POV . Per Jannifer Franklin, MD, patient is in need of higher level of care due to higher level of care needed. Patient is aware and verbalizes understanding of plan of care.  Vitals:   02/07/22 0838  BP: 104/75  Pulse: 84  Resp: 16  Temp: 98.1 F (36.7 C)  SpO2: 98%

## 2022-02-07 NOTE — ED Triage Notes (Signed)
Patient complains of RLQ abdominal pain and nausea that started yesterday. Seen at urgent care for same and referred to ED for further evaluation.

## 2022-02-07 NOTE — Discharge Instructions (Addendum)
Thank you for coming to [facility] Emergency Department. You were seen for abdominal pain. We did an exam, labs, and imaging, and these showed likely a urinary tract infection. Based on the degree of pain you had when you arrived, it is possible you are passing a kidney stone that a CT scan with contrast could have missed. Please take tylenol and ibuprofen at home for pain and call your primary care physician today to make a follow-up appointment.  We have prescribed an antibiotic called keflex to take four times per day for 10 days.  Please follow up with your primary care provider within 1 week.   Ultrasound did not show any abnormalities of your ovaries  Do not hesitate to return to the ED or call 911 if you experience: -Worsening symptoms -Lightheadedness, passing out -Fevers/chills -Anything else that concerns you

## 2022-02-07 NOTE — ED Triage Notes (Signed)
Pt is here for right hip pain and abdominal pain x2days

## 2022-02-09 LAB — URINE CULTURE

## 2022-02-19 ENCOUNTER — Ambulatory Visit (INDEPENDENT_AMBULATORY_CARE_PROVIDER_SITE_OTHER): Payer: Medicaid Other | Admitting: Primary Care

## 2022-03-31 ENCOUNTER — Inpatient Hospital Stay
Admit: 2022-03-31 | Discharge: 2022-03-31 | Disposition: A | Discharge status: Other Acute Inpatient Hospital | Payer: MEDICAID | Attending: Emergency Medicine

## 2022-03-31 DIAGNOSIS — H16001 Unspecified corneal ulcer, right eye: Secondary | ICD-10-CM

## 2022-03-31 MED ORDER — MOXIFLOXACIN HCL 0.5 % OP SOLN
0.5 % | Freq: Once | OPHTHALMIC | Status: DC
Start: 2022-03-31 — End: 2022-03-31

## 2022-03-31 MED ORDER — FLUORESCEIN SODIUM 1 MG OP STRP
1 MG | Freq: Once | OPHTHALMIC | Status: AC
Start: 2022-03-31 — End: 2022-03-31
  Administered 2022-03-31: 15:00:00 1 via OPHTHALMIC

## 2022-03-31 MED ORDER — TETRACAINE HCL 0.5 % OP SOLN
0.5 % | Freq: Once | OPHTHALMIC | Status: AC
Start: 2022-03-31 — End: 2022-03-31
  Administered 2022-03-31: 15:00:00 2 [drp] via OPHTHALMIC

## 2022-03-31 MED FILL — MOXIFLOXACIN HCL 0.5 % OP SOLN: 0.5 % | OPHTHALMIC | Qty: 3

## 2022-03-31 MED FILL — BIO GLO 1 MG OP STRP: 1 MG | OPHTHALMIC | Qty: 1
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
## 2022-06-04 ENCOUNTER — Emergency Department (HOSPITAL_BASED_OUTPATIENT_CLINIC_OR_DEPARTMENT_OTHER): Payer: No Typology Code available for payment source | Admitting: Radiology

## 2022-06-04 ENCOUNTER — Other Ambulatory Visit: Payer: Self-pay

## 2022-06-04 ENCOUNTER — Emergency Department (HOSPITAL_BASED_OUTPATIENT_CLINIC_OR_DEPARTMENT_OTHER)
Admission: EM | Admit: 2022-06-04 | Discharge: 2022-06-04 | Disposition: A | Discharge status: Home / Self Care | Payer: No Typology Code available for payment source | Attending: Emergency Medicine | Admitting: Emergency Medicine

## 2022-06-04 ENCOUNTER — Encounter (HOSPITAL_BASED_OUTPATIENT_CLINIC_OR_DEPARTMENT_OTHER): Payer: Self-pay

## 2022-06-04 ENCOUNTER — Emergency Department (HOSPITAL_BASED_OUTPATIENT_CLINIC_OR_DEPARTMENT_OTHER): Payer: No Typology Code available for payment source

## 2022-06-04 DIAGNOSIS — M25551 Pain in right hip: Secondary | ICD-10-CM | POA: Insufficient documentation

## 2022-06-04 DIAGNOSIS — Y92410 Unspecified street and highway as the place of occurrence of the external cause: Secondary | ICD-10-CM | POA: Insufficient documentation

## 2022-06-04 DIAGNOSIS — Z91040 Latex allergy status: Secondary | ICD-10-CM | POA: Insufficient documentation

## 2022-06-04 DIAGNOSIS — R519 Headache, unspecified: Secondary | ICD-10-CM | POA: Diagnosis present

## 2022-06-04 DIAGNOSIS — M25511 Pain in right shoulder: Secondary | ICD-10-CM | POA: Insufficient documentation

## 2022-06-04 MED ORDER — IBUPROFEN 800 MG PO TABS
800.0000 mg | ORAL_TABLET | Freq: Once | ORAL | Status: AC
  Administered 2022-06-04: 800 mg via ORAL
  Filled 2022-06-04: qty 1
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
## 2022-06-04 NOTE — ED Notes (Signed)
RN reviewed discharge instructions with pt. Pt verbalized understanding and had no further questions. VSS upon discharge.  

## 2022-06-04 NOTE — ED Notes (Addendum)
patient unable to give urine (just went to the bathroom)... she is confident she is not pregnant hx of tubal and menstrual was 05/29/22. understands and away of radiations risks if pregnant...Marland KitchenMarland Kitchen provider notified and agreeable to proceed with scans
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
## 2022-06-25 ENCOUNTER — Ambulatory Visit (INDEPENDENT_AMBULATORY_CARE_PROVIDER_SITE_OTHER): Payer: Self-pay | Admitting: Primary Care

## 2022-06-25 DIAGNOSIS — R030 Elevated blood-pressure reading, without diagnosis of hypertension: Secondary | ICD-10-CM

## 2022-06-25 DIAGNOSIS — Z6837 Body mass index (BMI) 37.0-37.9, adult: Secondary | ICD-10-CM
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
## 2022-06-25 NOTE — Patient Instructions (Signed)
Preventing Hypertension Hypertension, also called high blood pressure, is when the force of blood pumping through the arteries is too strong. Arteries are blood vessels that carry blood from the heart throughout the body. Often, hypertension does not cause symptoms until blood pressure is very high. It is important to have your blood pressure checked regularly. Diet and lifestyle changes can help you prevent hypertension, and they may make you feel better overall and improve your quality of life. If you already have hypertension, you may control it with diet and lifestyle changes, as well as with medicine. How can this condition affect me? Over time, hypertension can damage the arteries and decrease blood flow to important parts of the body, including the brain, heart, and kidneys. By keeping your blood pressure in a healthy range, you can help prevent complications like heart attack, heart failure, stroke, kidney failure, and vascular dementia. What can increase my risk? An unhealthy diet and a lack of physical activity can make you more likely to develop high blood pressure. Some other risk factors include: Age. The risk increases with age. Having family members who have had high blood pressure. Having certain health conditions, such as thyroid problems. Being overweight or obese. Drinking too much alcohol or caffeine. Having too much fat, sugar, calories, or salt (sodium) in your diet. Smoking or using illegal drugs. Taking certain medicines, such as antidepressants, decongestants, birth control pills, and NSAIDs, such as ibuprofen. What actions can I take to prevent or manage this condition? Work with your health care provider to make a hypertension prevention plan that works for you. You may be referred for counseling on a healthy diet and physical activity. Follow your plan and keep all follow-up visits. Diet changes Maintain a healthy diet. This includes: Eating less salt (sodium). Ask your  health care provider how much sodium is safe for you to have. The general recommendation is to have less than 1 tsp (2,300 mg) of sodium a day. Do not add salt to your food. Choose low-sodium options when grocery shopping and eating out. Limiting fats in your diet. You can do this by eating low-fat or fat-free dairy products and by eating less red meat. Eating more fruits, vegetables, and whole grains. Make a goal to eat: 1-2 cups of fresh fruits and vegetables each day. 3-4 servings of whole grains each day. Avoiding foods and beverages that have added sugars. Eating fish that contain healthy fats (omega-3 fatty acids), such as mackerel or salmon. If you need help putting together a healthy eating plan, try the DASH diet. This diet is high in fruits, vegetables, and whole grains. It is low in sodium, red meat, and added sugars. DASH stands for Dietary Approaches to Stop Hypertension. Lifestyle changes  Lose weight if you are overweight. Losing just 3-5% of your body weight can help prevent or control hypertension. For example, if your present weight is 200 lb (91 kg), a loss of 3-5% of your weight means losing 6-10 lb (2.7-4.5 kg). Ask your health care provider to help you with a diet and exercise plan to safely lose weight. Get enough exercise. Do at least 150 minutes of moderate-intensity exercise each week. You could do this in short exercise sessions several times a day, or you could do longer exercise sessions a few times a week. For example, you could take a brisk 10-minute walk or bike ride, 3 times a day, for 5 days a week. Find ways to reduce stress, such as exercising, meditating, listening to   music, or taking a yoga class. If you need help reducing stress, ask your health care provider. Do not use any products that contain nicotine or tobacco. These products include cigarettes, chewing tobacco, and vaping devices, such as e-cigarettes. Chemicals in tobacco and nicotine products raise your  blood pressure each time you use them. If you need help quitting, ask your health care provider. Learn how to check your blood pressure at home. Make sure that you know your personal target blood pressure, as told by your health care provider. Try to sleep 7-9 hours per night. Alcohol use Do not drink alcohol if: Your health care provider tells you not to drink. You are pregnant, may be pregnant, or are planning to become pregnant. If you drink alcohol: Limit how much you have to: 0-1 drink a day for women. 0-2 drinks a day for men. Know how much alcohol is in your drink. In the [place] one drink equals one 12 oz bottle of beer (355 mL), one 5 oz glass of wine (148 mL), or one 1 oz glass of hard liquor (44 mL). Medicines In addition to diet and lifestyle changes, your health care provider may recommend medicines to help lower your blood pressure. In general: You may need to try a few different medicines to find what works best for you. You may need to take more than one medicine. Take over-the-counter and prescription medicines only as told by your health care provider. Questions to ask your health care provider What is my blood pressure goal? How can I lower my risk for high blood pressure? How should I monitor my blood pressure at home? Where to find support Your health care provider can help you prevent hypertension and help you keep your blood pressure at a healthy level. Your local hospital or your community may also provide support services and prevention programs. The American Heart Association offers an online support network at supportnetwork.heart.org Where to find more information Learn more about hypertension from: National Heart, Lung, and Blood Institute: www.nhlbi.nih.gov Centers for Disease Control and Prevention: www.cdc.gov American Academy of Family Physicians: familydoctor.org Learn more about the DASH diet from: National Heart, Lung, and Blood Institute:  www.nhlbi.nih.gov Contact a health care provider if: You think you are having a reaction to medicines you have taken. You have recurrent headaches or feel dizzy. You have swelling in your ankles. You have trouble with your vision. Get help right away if: You have sudden, severe chest, back, or abdominal pain or discomfort. You have shortness of breath. You have a sudden, severe headache. These symptoms may be an emergency. Get help right away. Call 911. Do not wait to see if the symptoms will go away. Do not drive yourself to the hospital. Summary Hypertension often does not cause any symptoms until blood pressure is very high. It is important to get your blood pressure checked regularly. Diet and lifestyle changes are important steps in preventing hypertension. By keeping your blood pressure in a healthy range, you may prevent complications like heart attack, heart failure, stroke, and kidney failure. Work with your health care provider to make a hypertension prevention plan that works for you. This information is not intended to replace advice given to you by your health care provider. Make sure you discuss any questions you have with your health care provider. Document Revised: 12/06/2020 Document Reviewed: 12/06/2020 Elsevier Patient Education  2023 Elsevier Inc.  

## 2022-07-04 ENCOUNTER — Ambulatory Visit: Payer: Self-pay | Attending: Primary Care | Admitting: Physical Therapy

## 2022-08-25 ENCOUNTER — Encounter (INDEPENDENT_AMBULATORY_CARE_PROVIDER_SITE_OTHER): Payer: Self-pay

## 2022-08-25 ENCOUNTER — Ambulatory Visit (INDEPENDENT_AMBULATORY_CARE_PROVIDER_SITE_OTHER): Payer: Self-pay | Admitting: Primary Care

## 2022-10-10 ENCOUNTER — Encounter (HOSPITAL_BASED_OUTPATIENT_CLINIC_OR_DEPARTMENT_OTHER): Payer: Self-pay | Admitting: Emergency Medicine

## 2022-10-10 ENCOUNTER — Emergency Department (HOSPITAL_BASED_OUTPATIENT_CLINIC_OR_DEPARTMENT_OTHER): Payer: Self-pay

## 2022-10-10 ENCOUNTER — Other Ambulatory Visit (HOSPITAL_BASED_OUTPATIENT_CLINIC_OR_DEPARTMENT_OTHER): Payer: Self-pay

## 2022-10-10 ENCOUNTER — Emergency Department (HOSPITAL_BASED_OUTPATIENT_CLINIC_OR_DEPARTMENT_OTHER)
Admission: EM | Admit: 2022-10-10 | Discharge: 2022-10-10 | Disposition: A | Discharge status: Home / Self Care | Payer: Self-pay | Attending: Emergency Medicine | Admitting: Emergency Medicine

## 2022-10-10 ENCOUNTER — Other Ambulatory Visit: Payer: Self-pay

## 2022-10-10 DIAGNOSIS — O200 Threatened abortion: Secondary | ICD-10-CM | POA: Insufficient documentation

## 2022-10-10 DIAGNOSIS — O039 Complete or unspecified spontaneous abortion without complication: Secondary | ICD-10-CM

## 2022-10-10 DIAGNOSIS — Z3A01 Less than 8 weeks gestation of pregnancy: Secondary | ICD-10-CM | POA: Insufficient documentation

## 2022-10-10 DIAGNOSIS — Z91040 Latex allergy status: Secondary | ICD-10-CM | POA: Insufficient documentation

## 2022-10-10 LAB — URINALYSIS, W/ REFLEX TO CULTURE (INFECTION SUSPECTED)
Bilirubin Urine: NEGATIVE
Glucose, UA: NEGATIVE mg/dL
Ketones, ur: NEGATIVE mg/dL
Nitrite: POSITIVE — AB
Protein, ur: 100 mg/dL — AB
Specific Gravity, Urine: 1.02 (ref 1.005–1.030)
pH: 6.5 (ref 5.0–8.0)

## 2022-10-10 LAB — HCG, QUANTITATIVE, PREGNANCY: hCG, Beta Chain, Quant, S: <1 m[IU]/mL (ref <5)

## 2022-10-10 LAB — BASIC METABOLIC PANEL
Anion gap: 6 (ref 5–15)
BUN: 6 mg/dL (ref 6–20)
CO2: 25 mmol/L (ref 22–32)
Calcium: 9 mg/dL (ref 8.9–10.3)
Chloride: 108 mmol/L (ref 98–111)
Creatinine, Ser: 0.59 mg/dL (ref 0.44–1.00)
GFR, Estimated: >60 mL/min (ref >60)
Glucose, Bld: 91 mg/dL (ref 70–99)
Potassium: 3.8 mmol/L (ref 3.5–5.1)
Sodium: 139 mmol/L (ref 135–145)

## 2022-10-10 LAB — CBC
HCT: 30.5 % — ABNORMAL LOW (ref 36.0–46.0)
Hemoglobin: 9.1 g/dL — ABNORMAL LOW (ref 12.0–15.0)
MCH: 19.1 pg — ABNORMAL LOW (ref 26.0–34.0)
MCHC: 29.8 g/dL — ABNORMAL LOW (ref 30.0–36.0)
MCV: 63.9 fL — ABNORMAL LOW (ref 80.0–100.0)
Platelets: 294 10*3/uL (ref 150–400)
RBC: 4.77 MIL/uL (ref 3.87–5.11)
RDW: 20.9 % — ABNORMAL HIGH (ref 11.5–15.5)
WBC: 6 10*3/uL (ref 4.0–10.5)
nRBC: 0 % (ref 0.0–0.2)

## 2022-10-10 LAB — PREGNANCY, URINE: Preg Test, Ur: NEGATIVE

## 2022-10-10 MED ORDER — OXYCODONE-ACETAMINOPHEN 5-325 MG PO TABS
1.0000 | ORAL_TABLET | Freq: Four times a day (QID) | ORAL | 0 refills | Status: AC | PRN
  Filled 2022-10-10: qty 6, 2d supply, fill #0

## 2022-10-10 MED ORDER — KETOROLAC TROMETHAMINE 30 MG/ML IJ SOLN
15.0000 mg | Freq: Once | INTRAMUSCULAR | Status: AC
  Administered 2022-10-10: 15 mg via INTRAVENOUS
  Filled 2022-10-10: qty 1

## 2022-10-10 NOTE — ED Triage Notes (Signed)
Pt arrived POV from home, caox4, ambulatory c/o lower abd cramping that started last night with vaginal bleeding this morning, pt states has been light/spotting. Pt reports she took home pregnancy test approx 3 weeks ago that was positive, has not been seen by OB/GYN since to confirm. [id number].

## 2022-10-10 NOTE — Discharge Instructions (Signed)
Follow-up with your gynecologist as needed.

## 2022-10-10 NOTE — ED Provider Notes (Signed)
[place] EMERGENCY DEPARTMENT AT [facility] Provider Note   CSN: [id number] Arrival date & time: 10/10/22  1047     History  Chief Complaint  Patient presents with   Vaginal Bleeding    Angela Robertson is a 32 y.o. female.   Vaginal Bleeding Patient denies of pelvic pain and vaginal bleeding.  Began last night.  Cramping that feels like contractions.  Approximately 3 weeks ago had a positive pregnancy test.  Last menses was at the end of June into July.  States that was a normal pregnancy.  Has been pregnant 7 times before and has 5 children.  2 abortions.  Does have an obstetrician to follow with.  States she has had a little bleeding but not a large amount.     Home Medications Prior to Admission medications   Medication Sig Start Date End Date Taking? Authorizing Provider  oxyCODONE-acetaminophen (PERCOCET/ROXICET) 5-325 MG tablet Take 1 tablet by mouth every 6 (six) hours as needed for severe pain. 10/10/22  Yes Benjiman Core, MD  acetaminophen (TYLENOL) 500 MG tablet Take 1,000 mg by mouth 2 (two) times daily as needed for moderate pain, mild pain, headache or fever. Patient not taking: Reported on 06/25/2022    [provider]  phentermine 37.5 MG capsule Take 1 capsule (37.5 mg total) by mouth every morning. Patient not taking: Reported on 06/25/2022 12/02/21   Grayce Sessions, NP      Allergies    Peach [prunus persica] and Latex    Review of Systems   Review of Systems  Genitourinary:  Positive for vaginal bleeding.    Physical Exam Updated Vital Signs BP 134/75   Pulse 86   Temp 99.2 F (37.3 C) (Oral)   Resp 18   Ht 5\' 1"  (1.549 m)   Wt 92.1 kg   LMP 08/21/2022 (Approximate)   SpO2 98%   BMI 38.36 kg/m  Physical Exam Vitals and nursing note reviewed.  Cardiovascular:     Rate and Rhythm: Regular rhythm.  Abdominal:     Tenderness: There is abdominal tenderness.     Comments: Low abdominal/pelvic tenderness.  No rebound or  guarding.  No hernia palpated.  Skin:    Capillary Refill: Capillary refill takes less than 2 seconds.  Neurological:     Mental Status: She is alert and oriented to person, place, and time.     ED Results / Procedures / Treatments   Labs (all labs ordered are listed, but only abnormal results are displayed) Labs Reviewed  CBC - Abnormal; Notable for the following components:      Result Value   Hemoglobin 9.1 (*)    HCT 30.5 (*)    MCV 63.9 (*)    MCH 19.1 (*)    MCHC 29.8 (*)    RDW 20.9 (*)    All other components within normal limits  URINALYSIS, W/ REFLEX TO CULTURE (INFECTION SUSPECTED) - Abnormal; Notable for the following components:   Color, Urine RED (*)    Hgb urine dipstick LARGE (*)    Protein, ur 100 (*)    Nitrite POSITIVE (*)    Leukocytes,Ua SMALL (*)    Bacteria, UA FEW (*)    All other components within normal limits  PREGNANCY, URINE  HCG, QUANTITATIVE, PREGNANCY  BASIC METABOLIC PANEL    EKG None  Radiology US PELVIC COMPLETE W TRANSVAGINAL AND TORSION R/O  Result Date: 10/10/2022 CLINICAL DATA:  Day history of vaginal bleeding and right pelvic pain EXAM:  TRANSABDOMINAL AND TRANSVAGINAL ULTRASOUND OF PELVIS DOPPLER ULTRASOUND OF OVARIES TECHNIQUE: Both transabdominal and transvaginal ultrasound examinations of the pelvis were performed. Transabdominal technique was performed for global imaging of the pelvis including uterus, ovaries, adnexal regions, and pelvic cul-de-sac. It was necessary to proceed with endovaginal exam following the transabdominal exam to visualize the bilateral ovaries. Color and duplex Doppler ultrasound was utilized to evaluate blood flow to the ovaries. COMPARISON:  Ultrasound pelvis dated 02/07/2022 FINDINGS: Uterus Measurements: 8.9 cm in sagittal dimension. No fibroids or other mass visualized. Postsurgical changes from prior cesarean section. Endometrium Not well seen but measures approximately 8 mm. Right ovary Measurements: 3.3  x 2.3 x 1.7 cm = volume: 1.6 mL. Normal appearance. No adnexal mass. Left ovary Not seen. Pulsed Doppler evaluation of right ovary demonstrates normal low-resistance arterial and venous waveforms. Other findings No abnormal free fluid. IMPRESSION: 1. No sonographic finding of right ovarian torsion. 2. Left ovary is not seen. Electronically Signed   By: Agustin Cree M.D.   On: 10/10/2022 14:47    Procedures Procedures    Medications Ordered in ED Medications  ketorolac (TORADOL) 30 MG/ML injection 15 mg (15 mg Intravenous Given 10/10/22 1453)    ED Course/ Medical Decision Making/ A&P                                 Medical Decision Making Amount and/or Complexity of Data Reviewed Labs: ordered. Radiology: ordered.  Risk Prescription drug management.   Patient positive pregnancy test at home with pelvic pain.  Differential diagnose includes miscarriage, threatened miscarriage, ectopic pregnancy.  Also less likely causes such as appendicitis.  Pregnancy test here in the urine was negative.  Reassuring but still could have had miscarriage with retained products or potentially even ectopic that is not viable.  Will get ultrasound basic blood work and pelvic exam.  Blood work reassuring.  Quantitative hCG is negative also.  Pelvic exam does show some vaginal bleeding but no other discharge.  Cervical os is closed.  Ultrasound does not visualize left ovary well but endometrial stripe of 8 mm.  No mass seen.  However did have reported poor visualization of the stripe.  Most likely miscarriage with positive outpatient test that has now normalized with some bleeding.  Can follow-up with her obstetrician.  Appears to put discharge home with some pain medicines.  Does not appear to be infection at this time.        Final Clinical Impression(s) / ED Diagnoses Final diagnoses:  Miscarriage    Rx / DC Orders ED Discharge Orders          Ordered    oxyCODONE-acetaminophen (PERCOCET/ROXICET)  5-325 MG tablet  Every 6 hours PRN        10/10/22 1512              Benjiman Core, MD 10/10/22 1520

## 2022-10-16 ENCOUNTER — Other Ambulatory Visit (HOSPITAL_BASED_OUTPATIENT_CLINIC_OR_DEPARTMENT_OTHER): Payer: Self-pay

## 2022-10-23 ENCOUNTER — Ambulatory Visit (INDEPENDENT_AMBULATORY_CARE_PROVIDER_SITE_OTHER): Payer: Self-pay | Admitting: Primary Care

## 2022-11-12 ENCOUNTER — Encounter (INDEPENDENT_AMBULATORY_CARE_PROVIDER_SITE_OTHER): Payer: Self-pay | Admitting: Primary Care

## 2022-11-12 ENCOUNTER — Ambulatory Visit (INDEPENDENT_AMBULATORY_CARE_PROVIDER_SITE_OTHER): Payer: Self-pay | Admitting: Primary Care

## 2022-11-12 VITALS — BP 116/79 | HR 79 | Resp 16 | Wt 208.0 lb

## 2022-11-12 DIAGNOSIS — Z634 Disappearance and death of family member: Secondary | ICD-10-CM

## 2022-11-12 DIAGNOSIS — F4321 Adjustment disorder with depressed mood: Secondary | ICD-10-CM

## 2022-11-12 NOTE — Patient Instructions (Signed)
Managing Loss, Adult People experience loss in many different ways throughout their lives. Events such as moving, changing jobs, and losing friends can create a sense of loss. The loss may be as serious as a major health change, divorce, death of a pet, or death of a loved one. All of these types of loss are likely to create a physical and emotional reaction known as grief. Grief is the result of a major change or an absence of something or someone that you count on. Grief is a normal reaction to loss. A variety of factors can affect your grieving experience, including: The nature of your loss. Your relationship to what or whom you lost. Your understanding of grief and how to manage it. Your support system. Be aware that when grief becomes extreme, it can lead to more severe issues like isolation, depression, anxiety, or suicidal thoughts. Talk with your health care provider if you have any of these issues. How to manage lifestyle changes Keep to your normal routine as much as possible. If you have trouble focusing or doing normal activities, it is acceptable to take some time away from your normal routine. Spend time with friends and loved ones. Eat a healthy diet, get plenty of sleep, and rest when you feel tired. How to recognize changes  The way that you deal with your grief will affect your ability to function as you normally do. When grieving, you may experience these changes: Numbness, shock, sadness, anxiety, anger, denial, and guilt. Thoughts about death. Unexpected crying. A physical sensation of emptiness in your stomach. Problems sleeping and eating. Tiredness (fatigue). Loss of interest in normal activities. Dreaming about or imagining seeing the person who died. A need to remember what or whom you lost. Difficulty thinking about anything other than your loss for a period of time. Relief. If you have been expecting the loss for a while, you may feel a sense of relief when it  happens. Follow these instructions at home: Activity Express your feelings in healthy ways, such as: Talking with others about your loss. It may be helpful to find others who have had a similar loss, such as a support group. Writing down your feelings in a journal. Doing physical activities to release stress and emotional energy. Doing creative activities like painting, sculpting, or playing or listening to music. Practicing resilience. This is the ability to recover and adjust after facing challenges. Reading some resources that encourage resilience may help you to learn ways to practice those behaviors.  General instructions Be patient with yourself and others. Allow the grieving process to happen, and remember that grieving takes time. It is likely that you may never feel completely done with some grief. You may find a way to move on while still cherishing memories and feelings about your loss. Accepting your loss is a process. It can take months or longer to adjust. Keep all follow-up visits. This is important. Where to find support To get support for managing loss: Ask your health care provider for help and recommendations, such as grief counseling or therapy. Think about joining a support group for people who are managing a loss. Where to find more information You can find more information about managing loss from: American Society of Clinical Oncology: www.cancer.net American Psychological Association: www.apa.org Contact a health care provider if: Your grief is extreme and keeps getting worse. You have ongoing grief that does not improve. Your body shows symptoms of grief, such as illness. You feel depressed, anxious, or   hopeless. Get help right away if: You have thoughts about hurting yourself or others. Get help right away if you feel like you may hurt yourself or others, or have thoughts about taking your own life. Go to your nearest emergency room or: Call 911. Call the  National Suicide Prevention Lifeline at 1-800-273-8255 or 988. This is open 24 hours a day. Text the Crisis Text Line at 741741. Summary Grief is the result of a major change or an absence of someone or something that you count on. Grief is a normal reaction to loss. The depth of grief and the period of recovery depend on the type of loss and your ability to adjust to the change and process your feelings. Processing grief requires patience and a willingness to accept your feelings and talk about your loss with people who are supportive. It is important to find resources that work for you and to realize that people experience grief differently. There is not one grieving process that works for everyone in the same way. Be aware that when grief becomes extreme, it can lead to more severe issues like isolation, depression, anxiety, or suicidal thoughts. Talk with your health care provider if you have any of these issues. This information is not intended to replace advice given to you by your health care provider. Make sure you discuss any questions you have with your health care provider. Document Revised: 10/08/2020 Document Reviewed: 10/08/2020 Elsevier Patient Education  2024 Elsevier Inc.  

## 2022-11-12 NOTE — Progress Notes (Signed)
   Acute Office Visit  Subjective:     Patient ID: Angela Robertson, female    DOB: 1990-08-30, 32 y.o.   MRN: [id number]    HPI Angela Robertson is a 32 year old obese female who presents for an acute visit.  She is crying trying to tell writer what has been going on for the last 3 to 4 weeks.  She has lost of energy, no interest in doing anything, loss of appetite and stayed in bed crying because she thought by her tubes being tied she could not get pregnant.  Unfortunately she was pregnant and had a miscarriage.  At that time the symptoms started spiraling down.  She denies any suicidal or homicidal ideations.  She denies any auditory or visual hallucinations. Patient has No headache, No chest pain, No abdominal pain - No Nausea, No new weakness tingling or numbness, No Cough - shortness of breath   ROS Comprehensive ROS Pertinent positive and negative noted in HPI       Objective:    BP 116/79   Pulse 79   Resp 16   Wt 208 lb (94.3 kg)   LMP 08/21/2022 (Approximate)   SpO2 98%   Breastfeeding Unknown   BMI 39.30 kg/m    Physical Exam Vitals reviewed.  Constitutional:      Appearance: She is obese.  HENT:     Right Ear: External ear normal.     Left Ear: External ear normal.  Eyes:     Extraocular Movements: Extraocular movements intact.  Cardiovascular:     Rate and Rhythm: Normal rate and regular rhythm.  Pulmonary:     Effort: Pulmonary effort is normal.     Breath sounds: Normal breath sounds.  Abdominal:     General: Bowel sounds are normal. There is distension.     Palpations: Abdomen is soft.  Musculoskeletal:        General: Normal range of motion.     Cervical back: Normal range of motion and neck supple.  Skin:    General: Skin is warm and dry.  Neurological:     Mental Status: She is alert and oriented to person, place, and time.  Psychiatric:        Mood and Affect: Mood normal.        Behavior: Behavior normal.   No results found for any  visits on 11/12/22.      Assessment & Plan:  Angela Robertson was seen today for return to work note.  Diagnoses and all orders for this visit:  Grief at loss of child Refer to clinical social worker    Grayce Sessions, NP

## 2022-11-21 ENCOUNTER — Telehealth (INDEPENDENT_AMBULATORY_CARE_PROVIDER_SITE_OTHER): Payer: Self-pay | Admitting: Licensed Clinical Social Worker

## 2022-11-21 NOTE — Telephone Encounter (Signed)
[facility] contacted pt to evaluate her needs. Pt was referred by PCP for grief therapy since losing her child. Pt stated that she is interested in services and now has an appt. scheduled.

## 2022-12-04 ENCOUNTER — Institutional Professional Consult (permissible substitution) (INDEPENDENT_AMBULATORY_CARE_PROVIDER_SITE_OTHER): Payer: Self-pay | Admitting: Licensed Clinical Social Worker

## 2023-02-17 ENCOUNTER — Ambulatory Visit (HOSPITAL_COMMUNITY): Admission: EM | Admit: 2023-02-17 | Discharge: 2023-02-17 | Discharge status: Against Medical Advice | Payer: Self-pay

## 2023-02-17 NOTE — ED Notes (Signed)
No answer in waiting area x2 

## 2023-02-19 ENCOUNTER — Ambulatory Visit (INDEPENDENT_AMBULATORY_CARE_PROVIDER_SITE_OTHER): Payer: Self-pay

## 2023-02-19 ENCOUNTER — Other Ambulatory Visit (HOSPITAL_COMMUNITY)
Admission: RE | Admit: 2023-02-19 | Discharge: 2023-02-19 | Disposition: A | Discharge status: Home / Self Care | Payer: Self-pay | Source: Ambulatory Visit | Attending: Primary Care | Admitting: Primary Care

## 2023-02-19 DIAGNOSIS — Z113 Encounter for screening for infections with a predominantly sexual mode of transmission: Secondary | ICD-10-CM | POA: Insufficient documentation

## 2023-02-19 NOTE — Progress Notes (Signed)
Pt came into the office today for a vaginal swab

## 2023-02-20 LAB — CERVICOVAGINAL ANCILLARY ONLY
Bacterial Vaginitis (gardnerella): POSITIVE — AB
Candida Glabrata: NEGATIVE
Candida Vaginitis: POSITIVE — AB
Chlamydia: NEGATIVE
Comment: NEGATIVE
Comment: NEGATIVE
Comment: NEGATIVE
Comment: NEGATIVE
Comment: NEGATIVE
Comment: NORMAL
Neisseria Gonorrhea: NEGATIVE
Trichomonas: POSITIVE — AB

## 2023-02-21 ENCOUNTER — Ambulatory Visit

## 2023-02-21 ENCOUNTER — Ambulatory Visit: Admit: 2023-02-21 | Discharge: 2023-02-21 | Payer: Medicaid (Managed Care) | Attending: Family

## 2023-02-21 MED ORDER — METRONIDAZOLE 500 MG PO TABS
500 | ORAL_TABLET | Freq: Two times a day (BID) | ORAL | 0 refills | Status: AC
Start: 2023-02-21 — End: 2023-02-28

## 2023-02-21 MED ORDER — FLUCONAZOLE 150 MG PO TABS
150 | ORAL_TABLET | ORAL | 0 refills | Status: AC
Start: 2023-02-21 — End: 2023-02-25

## 2023-02-21 NOTE — Progress Notes (Signed)
 Subjective:      Patient ID: Annette Hale     Chief Complaint Exposure to STD (PT IS FROM [place] TESTED POS FOR TRICH BV AND YEAST GOT RESULTS YESTERDAY )       Time Patient seen by Provider: 10:14 AM      Exposure to STD      The patient

## 2023-02-26 ENCOUNTER — Other Ambulatory Visit (INDEPENDENT_AMBULATORY_CARE_PROVIDER_SITE_OTHER): Payer: Self-pay | Admitting: Primary Care

## 2023-02-26 DIAGNOSIS — B75 Trichinellosis: Secondary | ICD-10-CM

## 2023-02-26 MED ORDER — METRONIDAZOLE 500 MG PO TABS
500.0000 mg | ORAL_TABLET | Freq: Two times a day (BID) | ORAL | 0 refills | Status: DC
Start: 2023-02-26 — End: 2023-03-18

## 2023-02-26 MED ORDER — FLUCONAZOLE 150 MG PO TABS
150.0000 mg | ORAL_TABLET | Freq: Every day | ORAL | 1 refills | Status: AC
Start: 2023-02-26 — End: ?

## 2023-03-15 ENCOUNTER — Encounter (INDEPENDENT_AMBULATORY_CARE_PROVIDER_SITE_OTHER): Payer: Self-pay | Admitting: Primary Care

## 2023-03-18 ENCOUNTER — Other Ambulatory Visit (INDEPENDENT_AMBULATORY_CARE_PROVIDER_SITE_OTHER): Payer: Self-pay | Admitting: Primary Care

## 2023-03-18 MED ORDER — TINIDAZOLE 500 MG PO TABS: 2.0000 g | ORAL_TABLET | Freq: Every day | ORAL | 0 refills | Status: AC

## 2023-07-13 ENCOUNTER — Ambulatory Visit: Admit: 2023-07-13 | Discharge: 2023-07-13 | Payer: Medicaid (Managed Care)

## 2023-07-13 VITALS — BP 116/86 | HR 96 | Temp 98.80000°F | Resp 19 | Ht 61.5 in | Wt 208.0 lb

## 2023-07-13 DIAGNOSIS — M79644 Pain in right finger(s): Secondary | ICD-10-CM

## 2023-07-13 MED ORDER — FLUCONAZOLE 150 MG PO TABS
150 | ORAL_TABLET | ORAL | 0 refills | Status: AC
Start: 2023-07-13 — End: 2023-07-19

## 2023-07-13 NOTE — Progress Notes (Signed)
 Subjective:      Patient ID: Annette Hale     Chief Complaint Other (Given antibiotic from Dentist requesting fluconazole ) and Finger Pain (Right hand 3rd and 4th fingers radiating up to the right wrist.)     Time Patient seen by Provider:10:58 AM     HPI The patient is a 33 y.o. female with no sig pmhx presenting to the UC with 2 complaints:    1) right hand 3rd digit pain to joint closest to hand, range of motion intact however with pain   States that the pain radiates to right wrist, she has minimal pain to 4th digit   No over ecchymosis or redness noted  Pt denies weakness, numbness, tingling, worsening complaint at this time.    2) on antibiotic from dentist has a yeast infection   Requesting fluconazole      Review of Systems   Constitutional:         Requesting diflucan      Musculoskeletal:         Finger pain    All other systems reviewed and are negative.      Past Medical History:   Diagnosis Date    Allergic rhinitis         Past Surgical History:   Procedure Laterality Date    BREAST REDUCTION SURGERY      BUTTOCK LIFT      CESAREAN SECTION      SHOULDER SURGERY          Allergies   Allergen Reactions    Latex     Prunus Persica Anaphylaxis and Hives     Event:        Current Outpatient Medications   Medication Sig Dispense Refill    acetaminophen (TYLENOL) 500 MG tablet Take 2 tablets by mouth 2 times daily as needed      amoxicillin (AMOXIL) 250 MG capsule Take 1 capsule by mouth 3 times daily      fluconazole  (DIFLUCAN ) 150 MG tablet Take 1 tablet by mouth every 72 hours for 6 days 2 tablet 0     No current facility-administered medications for this visit.        BP 116/86   Pulse 96   Temp 98.8 F (37.1 C) (Oral)   Resp 19   Ht 1.562 m (5' 1.5")   Wt 94.3 kg (208 lb)   LMP 07/02/2023 (Exact Date)   SpO2 99%   BMI 38.66 kg/m       Objective:   Physical Exam  Vitals and nursing note reviewed.   Constitutional:       Appearance: Normal appearance.   HENT:      Head: Normocephalic and  atraumatic.      Mouth/Throat:      Mouth: Mucous membranes are moist.   Eyes:      Extraocular Movements: Extraocular movements intact.      Conjunctiva/sclera: Conjunctivae normal.      Pupils: Pupils are equal, round, and reactive to light.   Cardiovascular:      Rate and Rhythm: Normal rate and regular rhythm.      Pulses: Normal pulses.      Heart sounds: Normal heart sounds.   Pulmonary:      Effort: Pulmonary effort is normal.      Breath sounds: Normal breath sounds.   Musculoskeletal:      Comments: Right middle finger with minimal swelling noted, +tenderness to pip joint  No tenderness to 4th or other  digits 1,2,5   ROM intact  Sensation/motor/strength intact  Unable to assess capp refill as pt has acrylic nails on  +pulses to wrist     No tenderness to palp of wrist  ROM intact  Sensation/motor/strength intact    Skin:     General: Skin is warm.      Capillary Refill: Capillary refill takes less than 2 seconds.   Neurological:      General: No focal deficit present.      Mental Status: She is alert and oriented to person, place, and time.           Assessment/Plan:   1. Finger pain, right  -     XR FINGER RIGHT (MIN 2 VIEWS); Future  -     [facility] - Crosby Dooms MD, Orthopaedics [address]  2. Antibiotic-induced yeast infection  -     fluconazole  (DIFLUCAN ) 150 MG tablet; Take 1 tablet by mouth every 72 hours for 6 days, Disp-2 tablet, R-0Normal    FU with ortho if sxs persist  Supportive care discussed  RICE/ibuprofen prn     Results for orders placed or performed in visit on 07/13/23   XR FINGER RIGHT (MIN 2 VIEWS)    Narrative    Right finger series: 07/13/23    INDICATION: jammed her middle finger M79.644,Pain in right finger(s),ICD-10-CM    COMPARISON: None    FINDINGS: 3 views provided right middle finger. provided. No fracture,   dislocation, or periosteal reaction. No degenerative change. No focal lytic or   sclerotic lesion. No evident soft tissue abnormality.      Impression    No fracture  or dislocation.        I have reviewed prior visit notes and lab results pertinent to this visit.    Patient advised for any new, worsening or recurring symptoms to return to the nearest Emergency Department or Urgent Care. Patient advised regarding lab results, side effects of medications, diagnosis, and follow up. Pt verbalized understanding and return precautions discussed.     Latangela Mccomas, PA-C

## 2023-07-21 ENCOUNTER — Inpatient Hospital Stay
Admit: 2023-07-21 | Discharge: 2023-07-21 | Disposition: A | Discharge status: Home / Self Care | Payer: Medicaid (Managed Care)

## 2023-07-21 DIAGNOSIS — R112 Nausea with vomiting, unspecified: Secondary | ICD-10-CM

## 2023-07-21 DIAGNOSIS — R197 Diarrhea, unspecified: Secondary | ICD-10-CM

## 2023-07-21 LAB — COMPREHENSIVE METABOLIC PANEL
ALT: 14 U/L (ref 0–42)
AST: 19 U/L (ref 0–46)
Albumin/Globulin Ratio: 1.2 (ref 1.00–2.70)
Albumin: 3.9 g/dL (ref 3.5–5.2)
Alk Phosphatase: 54 U/L (ref 35–117)
Anion Gap: 10 mmol/L (ref 2–17)
BUN: 5 mg/dL — ABNORMAL LOW (ref 6–20)
CO2: 22 mmol/L (ref 22–29)
Calcium: 8.8 mg/dL (ref 8.5–10.7)
Chloride: 105 mmol/L (ref 98–107)
Creatinine: 0.6 mg/dL (ref 0.5–1.0)
Est, Glom Filt Rate: 120 mL/min/1.73mÂ² (ref >=60)
Globulin: 3 g/dL (ref 1.9–4.4)
Glucose: 91 mg/dL (ref 70–99)
Osmolaliy Calculated: 271 mosm/kg (ref 270–287)
Potassium: 3.6 mmol/L (ref 3.5–5.3)
Sodium: 137 mmol/L (ref 135–145)
Total Bilirubin: 0.32 mg/dL (ref 0.00–1.20)
Total Protein: 7.2 g/dL (ref 5.7–8.3)

## 2023-07-21 LAB — MORPHOLOGY CHECK
Platelet Estimate: ADEQUATE
RBC Morphology: ABNORMAL — AB

## 2023-07-21 LAB — CBC WITH AUTO DIFFERENTIAL
Basophils %: 0.6 % (ref 0.0–2.0)
Basophils Absolute: 0 10*3/uL (ref 0.0–0.2)
Eosinophils %: 0.9 % (ref 0.0–7.0)
Eosinophils Absolute: 0.1 10*3/uL (ref 0.0–0.5)
Hematocrit: 31.3 % — ABNORMAL LOW (ref 34.0–47.0)
Hemoglobin: 9.4 g/dL — ABNORMAL LOW (ref 11.5–15.7)
Immature Grans (Abs): 0 10*3/uL (ref 0.00–0.06)
Immature Granulocytes %: 0 % (ref 0.0–0.6)
Lymphocytes Absolute: 2.7 10*3/uL (ref 1.0–3.2)
Lymphocytes: 39.9 % (ref 15.0–45.0)
MCH: 19.7 pg — ABNORMAL LOW (ref 27.0–34.5)
MCHC: 30 g/dL (ref 30.0–36.0)
MCV: 65.6 fL — ABNORMAL LOW (ref 81.0–99.0)
MPV: 9.3 fL (ref 7.0–12.2)
Monocytes %: 7.2 % (ref 4.0–12.0)
Monocytes Absolute: 0.5 10*3/uL (ref 0.3–1.0)
Neutrophils %: 51.4 % (ref 42.0–74.0)
Neutrophils Absolute: 3.5 10*3/uL (ref 1.6–7.3)
Platelets: 275 10*3/uL (ref 140–440)
RBC: 4.77 x10e6/mcL (ref 3.60–5.20)
RDW: 20.8 % — ABNORMAL HIGH (ref 10.0–17.0)
WBC: 6.7 10*3/uL (ref 3.8–10.6)

## 2023-07-21 LAB — POC PREGNANCY UR-QUAL
HCG, Urine, POC: NEGATIVE
Lot Number: 1810872
Preg Test, Ur: NEGATIVE

## 2023-07-21 LAB — POC URINALYSIS, CHEMISTRY
Bilirubin, Urine, POC: NEGATIVE
Blood, UA POC: NEGATIVE
Glucose, UA POC: NEGATIVE mg/dL
Ketones, Urine, POC: NEGATIVE mg/dL
Leukocytes, UA: NEGATIVE
Nitrate, UA POC: NEGATIVE
Protein, Urine, POC: 30 — AB
Specific Gravity, Urine, POC: >=1.03 — AB (ref 1.003–1.035)
Urine Urobilinogen: 0.2 EU/dL (ref >0.2)
pH, Urine, POC: 6 (ref 4.5–8.0)

## 2023-07-21 LAB — LIPASE: Lipase: 24 U/L (ref 13–60)

## 2023-07-21 MED ORDER — ONDANSETRON 4 MG PO TBDP
4 | ORAL_TABLET | Freq: Three times a day (TID) | ORAL | 0 refills | 7.00 days | Status: AC | PRN
Start: 2023-07-21 — End: ?

## 2023-07-21 MED ORDER — DICYCLOMINE HCL 10 MG PO CAPS
10 | Freq: Once | ORAL | Status: AC
Start: 2023-07-21 — End: 2023-07-21
  Administered 2023-07-21: 14:00:00 20 mg via ORAL

## 2023-07-21 MED ORDER — ONDANSETRON HCL 4 MG/2ML IJ SOLN
4 | Freq: Once | INTRAMUSCULAR | Status: AC
Start: 2023-07-21 — End: 2023-07-21
  Administered 2023-07-21: 14:00:00 4 mg via INTRAVENOUS

## 2023-07-21 MED ORDER — DICYCLOMINE HCL 20 MG PO TABS
20 | ORAL_TABLET | Freq: Four times a day (QID) | ORAL | 0 refills | 30.00 days | Status: AC | PRN
Start: 2023-07-21 — End: ?

## 2023-07-21 MED ORDER — SODIUM CHLORIDE 0.9 % IV BOLUS
0.9 | Freq: Once | INTRAVENOUS | Status: AC
Start: 2023-07-21 — End: 2023-07-21
  Administered 2023-07-21: 14:00:00 1000 mL via INTRAVENOUS

## 2023-07-21 MED ORDER — KETOROLAC TROMETHAMINE 15 MG/ML IJ SOLN
15 | Freq: Once | INTRAMUSCULAR | Status: AC
Start: 2023-07-21 — End: 2023-07-21
  Administered 2023-07-21: 15:00:00 15 mg via INTRAVENOUS

## 2023-07-21 MED FILL — DICYCLOMINE HCL 10 MG PO CAPS: 10 MG | ORAL | Qty: 2 | Fill #0

## 2023-07-21 MED FILL — ONDANSETRON HCL 4 MG/2ML IJ SOLN: 4 MG/2ML | INTRAMUSCULAR | Qty: 2 | Fill #0

## 2023-07-21 MED FILL — KETOROLAC TROMETHAMINE 15 MG/ML IJ SOLN: 15 MG/ML | INTRAMUSCULAR | Qty: 1 | Fill #0

## 2023-07-21 NOTE — ED Provider Notes (Signed)
 [facility] EMERGENCY DEPT  EMERGENCY DEPARTMENT ENCOUNTER      Pt Name: Annette Hale  MRN: [id number]  Birthdate November 11, 1990  Date of evaluation: 07/21/2023  Provider: Donalee Fruits, PA    CHIEF COMPLAINT       Chief Complaint   Patient presents with    Vomiting     Pt c/o since yesterday having n/v/d and headache         HISTORY OF PRESENT ILLNESS    HPI    33 year old female presents for nausea vomiting diarrhea and headache since yesterday.  No abdominal pain.  Denies melena, hematochezia, hematemesis, constipation.  No neck pain, neck stiffness, fevers, chills, chest pain, shortness of breath, urinary complaints.  Unknown sick contacts.  No abnormal food exposures, recent antibiotic use, recent travel.    REVIEW OF SYSTEMS     Review of Systems  Except as noted above the remainder of the review of systems was reviewed and negative.     PAST MEDICAL HISTORY     Past Medical History:   Diagnosis Date    Allergic rhinitis      SURGICAL HISTORY       Past Surgical History:   Procedure Laterality Date    BREAST REDUCTION SURGERY      BUTTOCK LIFT      CESAREAN SECTION      SHOULDER SURGERY       CURRENT MEDICATIONS       Discharge Medication List as of 07/21/2023 11:02 AM        CONTINUE these medications which have NOT CHANGED    Details   acetaminophen (TYLENOL) 500 MG tablet Take 2 tablets by mouth 2 times daily as neededHistorical Med      amoxicillin (AMOXIL) 250 MG capsule Take 1 capsule by mouth 3 times dailyHistorical Med           ALLERGIES     Latex and Prunus persica  FAMILY HISTORY       Family History   Problem Relation Age of Onset    No Known Problems Mother     No Known Problems Father         SOCIAL HISTORY       Social History     Socioeconomic History    Marital status: Single   Tobacco Use    Smoking status: Never    Smokeless tobacco: Never   Vaping Use    Vaping status: Never Used   Substance and Sexual Activity    Alcohol use: Not Currently     Social Drivers of Health     Financial  Resource Strain: Low Risk  (06/25/2022)    Received from [facility]    Overall Financial Resource Strain (CARDIA)     Difficulty of Paying Living Expenses: Not hard at all   Food Insecurity: No Food Insecurity (06/25/2022)    Received from [facility]    Hunger Vital Sign     Worried About Running Out of Food in the Last Year: Never true     Ran Out of Food in the Last Year: Never true   Transportation Needs: No Transportation Needs (06/25/2022)    Received from [facility] - Transportation     Lack of Transportation (Medical): No     Lack of Transportation (Non-Medical): No   Physical Activity: Insufficiently Active (06/25/2022)    Received from [facility]    Exercise Vital Sign     Days  of Exercise per Week: 4 days     Minutes of Exercise per Session: 10 min   Stress: No Stress Concern Present (06/25/2022)    Received from [facility] - Occupational Stress Questionnaire     Feeling of Stress : Not at all   Social Connections: Moderately Isolated (06/25/2022)    Received from [facility]    Social Connection and Isolation Panel [NHANES]     Frequency of Communication with Friends and Family: More than three times a week     Frequency of Social Gatherings with Friends and Family: Once a week     Attends Religious Services: Never     Database administrator or Organizations: No     Marital Status: Married    Received from [employer]    HITS     SCREENINGS       Glasgow Coma Scale  Eye Opening: Spontaneous  Best Verbal Response: Oriented  Best Motor Response: Obeys commands  Glasgow Coma Scale Score: 15             CIWA Assessment  BP: 134/85  Pulse: 92           PHYSICAL EXAM       ED Triage Vitals [07/21/23 0928]   BP Systolic BP Percentile Diastolic BP Percentile Temp Temp Source Pulse Respirations SpO2   134/85 -- -- 98.8 F (37.1 C) Oral 92 16 99 %      Height Weight - Scale         1.575 m (5\' 2" ) 81.6 kg (180 lb)             Physical Exam  Constitutional:        General: She is not in acute distress.     Appearance: She is well-developed. She is not ill-appearing or toxic-appearing.   HENT:      Head: Normocephalic.   Eyes:      Extraocular Movements: Extraocular movements intact.      Conjunctiva/sclera: Conjunctivae normal.      Pupils: Pupils are equal, round, and reactive to light.   Cardiovascular:      Rate and Rhythm: Normal rate and regular rhythm.      Heart sounds: Normal heart sounds. No murmur heard.  Pulmonary:      Effort: Pulmonary effort is normal. No respiratory distress.      Breath sounds: Normal breath sounds. No wheezing.   Abdominal:      General: Abdomen is flat. There is no distension.      Palpations: Abdomen is soft. There is no mass.      Tenderness: There is no abdominal tenderness.      Hernia: No hernia is present.   Musculoskeletal:      Cervical back: Normal range of motion and neck supple.   Skin:     General: Skin is warm and dry.      Capillary Refill: Capillary refill takes less than 2 seconds.   Neurological:      General: No focal deficit present.      Mental Status: She is alert and oriented to person, place, and time.   Psychiatric:         Mood and Affect: Mood normal.         DIAGNOSTIC RESULTS   PROCEDURES:  Unless otherwise noted below, none     Procedures    EKG: All EKG's are interpreted by the Emergency Department  Physician who either signs or Co-signs this chart in the absence of a cardiologist.      RADIOLOGY:   Non-plain film images such as CT, Ultrasound and MRI are read by the radiologist. Plain radiographic images are visualized and preliminarily interpreted by the emergency physician with the below findings:    Interpretation per the Radiologist below, if available at the time of this note:  No orders to display       LABS:  Labs Reviewed   COMPREHENSIVE METABOLIC PANEL - Abnormal; Notable for the following components:       Result Value    BUN 5 (*)     All other components within normal limits   CBC WITH AUTO  DIFFERENTIAL - Abnormal; Notable for the following components:    Hemoglobin 9.4 (*)     Hematocrit 31.3 (*)     MCV 65.6 (*)     MCH 19.7 (*)     RDW 20.8 (*)     All other components within normal limits   MORPHOLOGY CHECK - Abnormal; Notable for the following components:    RBC Morphology Abnormal (*)     Hypochromia Few (*)     Anisocytosis Few (*)     Microcytes Few (*)     Poikilocytes Few (*)     Ovalocytes Few (*)     All other components within normal limits   POC URINALYSIS, CHEMISTRY - Abnormal; Notable for the following components:    Protein, Urine, POC 30 (*)     Specific Gravity, Urine, POC >=1.030 (*)     All other components within normal limits   LIPASE   POC PREGNANCY UR-QUAL   POC PREGNANCY UR-QUAL     All other labs were within normal range or not returned as of this dictation.  EMERGENCY DEPARTMENT COURSE/REASSESSMENT and MDM:   Vitals:    Vitals:    07/21/23 0928 07/21/23 1020 07/21/23 1100   BP: 134/85     Pulse: 92     Resp: 16 16 12    Temp: 98.8 F (37.1 C)     TempSrc: Oral     SpO2: 99% 100% 100%   Weight: 81.6 kg (180 lb)     Height: 1.575 m (5\' 2" )             MDM  Patient with nausea vomiting diarrhea and headache since yesterday.  No abdominal pain, fevers.  Afebrile hemodynamically stable, abdominal exam benign.  No abdominal pain, CT of the abdomen deferred.  Workup with chronic and stable anemia otherwise reassuring.  Given IV fluids antiemetics and Bentyl reports improvement.  Feeling much better on reexamination and tolerating p.o.  Symptoms likely gastroenteritis.  Encourage ORS will Rx antiemetics and Bentyl.  Stable for discharge.  Given strict return precautions    ED Course:    ED Course as of 07/21/23 1118   Tue Jul 21, 2023   1046 Hemoglobin Quant(!): 9.4  Similar to priors [AW]      ED Course User Index  [AW] Joanne Salah, Sydelle Evan, PA       CONSULTS:  None    FINAL IMPRESSION      1. Nausea vomiting and diarrhea          DISPOSITION/PLAN   DISPOSITION Decision To  Discharge 07/21/2023 10:59:18 AM   DISPOSITION CONDITION Stable           PATIENT REFERRED TO:  Primary care provider    In 1 week  DISCHARGE MEDICATIONS:  Discharge Medication List as of 07/21/2023 11:02 AM        START taking these medications    Details   dicyclomine (BENTYL) 20 MG tablet Take 1 tablet by mouth every 6 hours as needed (Abdominal Pain), Disp-30 tablet, R-0Print      ondansetron (ZOFRAN-ODT) 4 MG disintegrating tablet Take 1 tablet by mouth 3 times daily as needed for Nausea or Vomiting, Disp-21 tablet, R-0Print           (Please note that portions of this note were completed with a voice recognition program.  Efforts were made to edit the dictations but occasionally words are mis-transcribed.)    Laria Grimmett Beatriz Bouillon, PA (electronically signed)  Attending Emergency Physician          Donalee Fruits, PA  07/21/23 1118

## 2023-09-23 ENCOUNTER — Ambulatory Visit: Admit: 2023-09-23 | Discharge: 2023-09-23 | Payer: Medicaid (Managed Care)

## 2023-09-23 ENCOUNTER — Encounter

## 2023-09-23 DIAGNOSIS — R102 Pelvic and perineal pain: Principal | ICD-10-CM

## 2023-09-23 LAB — AMB POC URINALYSIS DIP STICK AUTO W/O MICRO
Bilirubin, Urine, POC: NEGATIVE
Blood (UA POC): NEGATIVE
Glucose, Urine, POC: NEGATIVE
Ketones, Urine, POC: NEGATIVE
Leukocyte Esterase, Urine, POC: NEGATIVE
Nitrite, Urine, POC: NEGATIVE
Specific Gravity, Urine, POC: 1.03 (ref 1.001–1.035)
Urobilinogen, POC: 0.2 mg/dL (ref <1.1)
pH, Urine, POC: 5.5 (ref 4.6–8.0)

## 2023-09-23 LAB — AMB POC URINE PREGNANCY TEST, VISUAL COLOR COMPARISON: HCG, Pregnancy, Urine, POC: NEGATIVE

## 2023-09-23 MED ORDER — DOXYCYCLINE HYCLATE 100 MG PO TABS
100 | ORAL_TABLET | Freq: Two times a day (BID) | ORAL | 0 refills | Status: AC
Start: 2023-09-23 — End: 2023-09-30

## 2023-09-23 MED ORDER — CEFTRIAXONE SODIUM 500 MG IJ SOLR
500 | Freq: Once | INTRAMUSCULAR | Status: AC
Start: 2023-09-23 — End: 2023-09-23
  Administered 2023-09-23: 15:00:00 500 mg via INTRAMUSCULAR

## 2023-09-23 NOTE — Progress Notes (Signed)
 Subjective:      Patient ID: Annette Hale     Chief Complaint Exposure to STD (Pt states that she is having some pelvic pains for the last 2 days.  )     Time Patient seen by Provider:11:10 AM       Exposure to STD   The patient's primary symptoms include pelvic pain.    The patient is a 33 y.o. female with no sig pmhx presenting to  pressure sensation to the lower pelvis x 2 days. States that she was involved with someone sexually and reports no sxs from that, however at 3am this AM another female who was involved with the same partner reported that she had an STD. It is unclear which STD, pt does not know. She does not have any vaginal discharge at the moment. Pt denies dysuria, frequency, hx of STDs, abd pain, n/v/d/c, worsening complaints at this time.     Review of Systems   Genitourinary:  Positive for pelvic pain.   All other systems reviewed and are negative.      Past Medical History:   Diagnosis Date    Allergic rhinitis         Past Surgical History:   Procedure Laterality Date    BREAST REDUCTION SURGERY      BUTTOCK LIFT      CESAREAN SECTION      SHOULDER SURGERY          Allergies   Allergen Reactions    Latex     Prunus Persica Anaphylaxis and Hives     Event:        Current Outpatient Medications   Medication Sig Dispense Refill    doxycycline  hyclate (VIBRA -TABS) 100 MG tablet Take 1 tablet by mouth 2 times daily for 7 days 14 tablet 0    dicyclomine  (BENTYL ) 20 MG tablet Take 1 tablet by mouth every 6 hours as needed (Abdominal Pain) (Patient not taking: Reported on 09/23/2023) 30 tablet 0    ondansetron  (ZOFRAN -ODT) 4 MG disintegrating tablet Take 1 tablet by mouth 3 times daily as needed for Nausea or Vomiting (Patient not taking: Reported on 09/23/2023) 21 tablet 0    acetaminophen (TYLENOL) 500 MG tablet Take 2 tablets by mouth 2 times daily as needed (Patient not taking: Reported on 09/23/2023)      amoxicillin (AMOXIL) 250 MG capsule Take 1 capsule by mouth 3 times daily (Patient not  taking: Reported on 09/23/2023)       Current Facility-Administered Medications   Medication Dose Route Frequency Provider Last Rate Last Admin    cefTRIAXone  (ROCEPHIN ) injection 500 mg  500 mg IntraMUSCular Once             BP 124/78 (BP Site: Right Upper Arm, Patient Position: Sitting, BP Cuff Size: Large Adult)   Pulse 86   Temp 98.5 F (36.9 C) (Oral)   Resp 16   Wt 95.4 kg (210 lb 6.4 oz)   SpO2 99%   BMI 38.48 kg/m       Objective:   Physical Exam  Vitals and nursing note reviewed.   Constitutional:       Appearance: Normal appearance.   HENT:      Head: Normocephalic and atraumatic.      Nose: Nose normal.      Mouth/Throat:      Mouth: Mucous membranes are moist.   Eyes:      Extraocular Movements: Extraocular movements intact.  Conjunctiva/sclera: Conjunctivae normal.      Pupils: Pupils are equal, round, and reactive to light.   Cardiovascular:      Rate and Rhythm: Normal rate and regular rhythm.      Pulses: Normal pulses.      Heart sounds: Normal heart sounds.   Pulmonary:      Effort: Pulmonary effort is normal.      Breath sounds: Normal breath sounds.   Abdominal:      General: Abdomen is flat. Bowel sounds are normal.      Palpations: Abdomen is soft.      Tenderness: There is no abdominal tenderness.   Skin:     General: Skin is warm.      Capillary Refill: Capillary refill takes less than 2 seconds.   Neurological:      General: No focal deficit present.      Mental Status: She is alert and oriented to person, place, and time.           Assessment/Plan:   1. Pelvic pain  -     Vaginitis-Bacteria/Yeast-Candida/Trich/GC/CT  -     AMB POC URINALYSIS DIP STICK AUTO W/O MICRO; Future  -     POC Urine Pregnancy Test (18974)  -     cefTRIAXone  (ROCEPHIN ) injection 500 mg; 500 mg, IntraMUSCular, ONCE, 1 dose, On Wed 09/23/23 at 1130Antimicrobial Indications: STD infectionSuspected Organism(s): gonorrhea  -     doxycycline  hyclate (VIBRA -TABS) 100 MG tablet; Take 1 tablet by mouth 2 times daily  for 7 days, Disp-14 tablet, R-0Normal  2. Screen for STD (sexually transmitted disease)  -     Hepatitis Panel, Acute; Future  -     HIV Screen  -     RPR; Future     Refrain from sexual intercourse until labs are back and you are appropriately treated  She was treated for GC at patient request  Supportive care and safe sex practices discussed  FU with with GYN if pelvic pain continues  For any new, worsening or recurring sxs please go to the ER     Results for orders placed or performed in visit on 09/23/23   POC Urine Pregnancy Test (18974)   Result Value Ref Range    Valid Internal Control, POC valid     HCG, Pregnancy, Urine, POC Negative    AMB POC URINALYSIS DIP STICK AUTO W/O MICRO   Result Value Ref Range    Color (UA POC) Yellow     Clarity (UA POC) Clear     Glucose, Urine, POC Negative     Bilirubin, Urine, POC Negative     Ketones, Urine, POC Negative     Specific Gravity, Urine, POC 1.030 1.001 - 1.035    Blood (UA POC) Negative     pH, Urine, POC 5.5 4.6 - 8.0    Protein, Urine, POC Trace     Urobilinogen, POC 0.2 mg/dL <8.8 mg/dL    Nitrite, Urine, POC Negative Negative    Leukocyte Esterase, Urine, POC Negative         I have reviewed prior visit notes and lab results pertinent to this visit.    Patient advised for any new, worsening or recurring symptoms to return to the nearest Emergency Department or Urgent Care. Patient advised regarding lab results, side effects of medications, diagnosis, and follow up. Pt verbalized understanding and return precautions discussed.     Ahmaya Ostermiller, PA-C

## 2023-09-23 NOTE — Progress Notes (Signed)
 Sent a urine [place] and a vaginal swab to lab

## 2023-09-24 LAB — HEPATITIS PANEL, ACUTE
Hep A IgM: NEGATIVE
Hep B Core Ab, IgM: NEGATIVE
Hepatitis B Surface Ag: NEGATIVE
Hepatitis C Ab: NEGATIVE

## 2023-09-24 LAB — HIV SCREEN: HIV Screen: NEGATIVE

## 2023-09-24 LAB — RPR: RPR: NONREACTIVE

## 2023-09-25 ENCOUNTER — Encounter

## 2023-09-25 LAB — VAGINITIS-BACTERIA/YEAST-CANDIDA/TRICH/GC/CT
Atopobium Vaginae: HIGH — AB
Bacterial Vaginosis Associated Bacterium 2 DNA: HIGH — AB
Candida Glabrata, NAA: NEGATIVE
Candida albicans, NAA: NEGATIVE
Chlamydia trachomatis, NAA: NEGATIVE
Megasphaera DNA, Vag: HIGH — AB
N. Gonorrhoeae Amplified: NEGATIVE
Trichomonas Vaginalis by NAA: NEGATIVE

## 2023-09-25 NOTE — Telephone Encounter (Signed)
 Let patient know that her vaginal swab came back positive for bacterial vaginosis.  Negative for yeast chlamydia gonorrhea and trichomonas.  Patient is also negative for HIV syphilis and hepatitis.  I recommend patient stopping doxycycline  and starting Flagyl , antibiotic to treat bacterial vaginosis.  Do not drink alcohol while taking Flagyl  and for 4 to 5 days afterwards.  I recommend taking over-the-counter probiotic and or yogurt daily while taking antibiotic to prevent GI upset/yeast infection.  Follow-up if symptoms do not resolve with treatment or anytime if symptoms worsen.    What pharmacy does patient want to use?

## 2023-09-26 MED ORDER — METRONIDAZOLE 500 MG PO TABS
500 | ORAL_TABLET | Freq: Two times a day (BID) | ORAL | 0 refills | 7.00000 days | Status: AC
Start: 2023-09-26 — End: 2023-10-03

## 2023-09-26 NOTE — Telephone Encounter (Signed)
 Spoke with pt verbalized understanding. Confirmed [facility] [address] as preferred pharmacy. No further questions at this time.

## 2023-09-26 NOTE — Telephone Encounter (Signed)
Medication successfully sent to pharmacy

## 2023-10-06 NOTE — Progress Notes (Signed)
 This encounter was created in error - please disregard.

## 2024-03-29 ENCOUNTER — Ambulatory Visit: Admit: 2024-03-29 | Discharge: 2024-03-29 | Payer: Medicaid (Managed Care)

## 2024-03-29 MED ORDER — VALACYCLOVIR HCL 1 G PO TABS
1 | ORAL_TABLET | Freq: Two times a day (BID) | ORAL | 0 refills | Status: AC
Start: 2024-03-29 — End: 2024-03-31

## 2024-03-29 MED ORDER — DEXAMETHASONE SODIUM PHOSPHATE 10 MG/ML IJ SOLN
10 | Freq: Once | INTRAMUSCULAR | Status: AC
Start: 2024-03-29 — End: 2024-03-29
  Administered 2024-03-29: 15:00:00 10 mg via INTRAMUSCULAR

## 2024-03-29 NOTE — Progress Notes (Signed)
 Subjective:      Patient ID: Annette Hale     Chief Complaint Allergic Reaction (Swelling and itching of bilateral lips, possible allergy to crawfish started last night )     Time Patient seen by Provider:10:10 AM     HPI The patient is a 34 y.o. female with no pmhx presenting to the UC with possible allergic reaction x lips x last night. Pt states that she eats crab and shrimp, but last night she had crawfish for the first time. She felt overnight that her lips were tingling. This AM she had swelling and tingling to both lips. No hx of cold sores in the past. She put an ointment on her lips this AM, the swelling has improved, however its still minimally there. Pt denies fever, sob, wheezing, rash, worsening complaints at this time.     Review of Systems   Constitutional:         Lip swelling   All other systems reviewed and are negative.      Past Medical History:   Diagnosis Date    Allergic rhinitis         Past Surgical History:   Procedure Laterality Date    BREAST REDUCTION SURGERY      BUTTOCK LIFT      CESAREAN SECTION      SHOULDER SURGERY          Allergies   Allergen Reactions    Latex     Prunus Persica Anaphylaxis and Hives     Event:        Current Outpatient Medications   Medication Sig Dispense Refill    valACYclovir  (VALTREX ) 1 g tablet Take 1 tablet by mouth 2 times daily for 2 days 4 tablet 0    dicyclomine  (BENTYL ) 20 MG tablet Take 1 tablet by mouth every 6 hours as needed (Abdominal Pain) (Patient not taking: Reported on 09/23/2023) 30 tablet 0    ondansetron  (ZOFRAN -ODT) 4 MG disintegrating tablet Take 1 tablet by mouth 3 times daily as needed for Nausea or Vomiting (Patient not taking: Reported on 09/23/2023) 21 tablet 0    acetaminophen (TYLENOL) 500 MG tablet Take 2 tablets by mouth 2 times daily as needed (Patient not taking: Reported on 09/23/2023)      amoxicillin (AMOXIL) 250 MG capsule Take 1 capsule by mouth 3 times daily (Patient not taking: Reported on 09/23/2023)       Current  Facility-Administered Medications   Medication Dose Route Frequency Provider Last Rate Last Admin    dexAMETHasone  (DECADRON ) IntraMUSCular 10 mg  10 mg IntraMUSCular Once             BP 104/76   Pulse 88   Temp 98.4 F (36.9 C) (Oral)   Resp 19   Ht 1.575 m (5' 2)   Wt 80.3 kg (177 lb)   LMP 03/27/2024 (Exact Date)   SpO2 98%   BMI 32.37 kg/m       Objective:   Physical Exam  Vitals and nursing note reviewed.   Constitutional:       Appearance: Normal appearance.   HENT:      Head: Normocephalic and atraumatic.      Nose: Nose normal.      Mouth/Throat:      Mouth: Mucous membranes are moist.      Pharynx: No oropharyngeal exudate or posterior oropharyngeal erythema.      Comments: Middle of upper lip with swelling with possible small vesicles  Right upper lip with some minimal swelling and redness     Posterior pharynx: airway patent, no swelling   Eyes:      Extraocular Movements: Extraocular movements intact.      Conjunctiva/sclera: Conjunctivae normal.      Pupils: Pupils are equal, round, and reactive to light.   Cardiovascular:      Rate and Rhythm: Normal rate and regular rhythm.      Pulses: Normal pulses.      Heart sounds: Normal heart sounds.   Pulmonary:      Effort: Pulmonary effort is normal.      Breath sounds: Normal breath sounds. No wheezing.   Skin:     General: Skin is warm.      Capillary Refill: Capillary refill takes less than 2 seconds.      Findings: No rash.   Neurological:      General: No focal deficit present.      Mental Status: She is alert and oriented to person, place, and time. Mental status is at baseline.           Assessment/Plan:   1. Allergic reaction, initial encounter  -     dexAMETHasone  (DECADRON ) IntraMUSCular 10 mg; 10 mg, IntraMUSCular, ONCE, 1 dose, On Tue 03/29/24 at 1030  2. Oral lesion  -     valACYclovir  (VALTREX ) 1 g tablet; Take 1 tablet by mouth 2 times daily for 2 days, Disp-4 tablet, R-0Normal     Supportive care discussed  Take benadryl at night  before bed  Avoid crawfish  If vesicular cold sore appears can initial valacyclovir   Er precautions discussed    No results found for any visits on 03/29/24.     I have reviewed prior visit notes and lab results pertinent to this visit.    Patient advised for any new, worsening or recurring symptoms to return to the nearest Emergency Department or Urgent Care. Patient advised regarding lab results, side effects of medications, diagnosis, and follow up. Pt verbalized understanding and return precautions discussed.     Perlie Stene, PA-C
